# Patient Record
Sex: Female | Born: 1990 | Race: White | Hispanic: No | Marital: Married | Smoking: Never smoker
Health system: Southern US, Community
[De-identification: ages and names within clinical notes are randomized; demographics above are authoritative.]

## PROBLEM LIST (undated history)

## (undated) ENCOUNTER — Inpatient Hospital Stay: Payer: Self-pay

## (undated) DIAGNOSIS — F419 Anxiety disorder, unspecified: Secondary | ICD-10-CM

## (undated) DIAGNOSIS — R519 Headache, unspecified: Secondary | ICD-10-CM

## (undated) DIAGNOSIS — Z8489 Family history of other specified conditions: Secondary | ICD-10-CM

## (undated) DIAGNOSIS — R51 Headache: Secondary | ICD-10-CM

## (undated) HISTORY — DX: Anxiety disorder, unspecified: F41.9

## (undated) HISTORY — PX: NO PAST SURGERIES: SHX2092

## (undated) HISTORY — PX: TUBAL LIGATION: SHX77

---

## 2009-04-14 ENCOUNTER — Ambulatory Visit: Payer: Self-pay

## 2009-05-14 ENCOUNTER — Inpatient Hospital Stay: Payer: Self-pay | Admitting: Unknown Physician Specialty

## 2011-09-12 ENCOUNTER — Ambulatory Visit: Payer: Self-pay

## 2014-09-16 ENCOUNTER — Inpatient Hospital Stay: Payer: Self-pay

## 2014-09-16 LAB — CBC WITH DIFFERENTIAL/PLATELET
Basophil #: 0 10*3/uL (ref 0.0–0.1)
Basophil %: 0.3 %
EOS ABS: 0 10*3/uL (ref 0.0–0.7)
Eosinophil %: 0.2 %
HCT: 32.5 % — ABNORMAL LOW (ref 35.0–47.0)
HGB: 10.5 g/dL — ABNORMAL LOW (ref 12.0–16.0)
LYMPHS PCT: 23.1 %
Lymphocyte #: 3 10*3/uL (ref 1.0–3.6)
MCH: 27.1 pg (ref 26.0–34.0)
MCHC: 32.3 g/dL (ref 32.0–36.0)
MCV: 84 fL (ref 80–100)
Monocyte #: 0.9 x10 3/mm (ref 0.2–0.9)
Monocyte %: 7 %
NEUTROS ABS: 8.9 10*3/uL — AB (ref 1.4–6.5)
NEUTROS PCT: 69.4 %
Platelet: 315 10*3/uL (ref 150–440)
RBC: 3.87 10*6/uL (ref 3.80–5.20)
RDW: 14.3 % (ref 11.5–14.5)
WBC: 12.8 10*3/uL — ABNORMAL HIGH (ref 3.6–11.0)

## 2014-09-17 LAB — GC/CHLAMYDIA PROBE AMP

## 2014-09-17 LAB — HEMATOCRIT: HCT: 29.9 % — ABNORMAL LOW (ref 35.0–47.0)

## 2014-12-28 NOTE — H&P (Signed)
L&D Evaluation:  History:  HPI 24 year old G3 p1011 with EDC= 09/25/2014 by LMP=12/19/2013 and c/w a 7wk6d ultrasound presents at 38 5/7 weeks with c/o onset ctxs at 0545 this Am. SROM for clear fluid shortly after arrival at [facility]. [facility] unremark[facility] except for heartburn/reflux for which she has taken Zantac and Protonix. No VB. Baby active. LABS: O POS/RI/VI/GBS neg. Will be breastfeeding baby Reuel BoomDaniel. Had TDAP this pregnancy. HX of rapid labor with G1 delivering a 7#8oz baby in 2010   Presents with contractions   Patient's Medical History heartburn/GERD   Patient's Surgical History none   Medications Pre Natal Vitamins   Allergies Sulfa, and Benadryl   Social History none   Family History Non-Contributory   ROS:  ROS see [facility]   Exam:  Vital Signs stable   General breathing with contractions   Mental Status clear   Chest clear   Heart normal sinus rhythm, no murmur/gallop/rubs   Abdomen gravid, tender with contractions   Estimated Fetal Weight Average for gestational age   Fetal Position cephalic   Edema no edema   Pelvic no external lesions, 5/90%/-1 per RN exam   Mebranes Ruptured, gross rupture   Description clear   FHT normal rate with no decels, 135 baseline with accels to 150s to 160s   FHT Description mod variability   Ucx regular   Ucx Frequency 4 min   Skin dry   Impression:  Impression IUP at 38 5/7 weeks in active labor   Plan:  Plan EFM/NST, monitor contractions and for cervical change, expectanat management   Electronic Signatures: Trinna BalloonGutierrez, Maitland Muhlbauer L (CNM)  (Signed 28-Jan-16 08:51)  Authored: L&D Evaluation   Last Updated: 28-Jan-16 08:51 by Trinna BalloonGutierrez, Saliah Crisp L (CNM)

## 2016-05-08 ENCOUNTER — Encounter
Admission: RE | Admit: 2016-05-08 | Discharge: 2016-05-08 | Disposition: A | Discharge status: Home / Self Care | Payer: 59 | Source: Ambulatory Visit | Attending: Obstetrics & Gynecology | Admitting: Obstetrics & Gynecology

## 2016-05-08 ENCOUNTER — Encounter: Payer: Self-pay | Admitting: *Deleted

## 2016-05-08 DIAGNOSIS — Z01818 Encounter for other preprocedural examination: Secondary | ICD-10-CM | POA: Diagnosis present

## 2016-05-08 HISTORY — DX: Headache, unspecified: R51.9

## 2016-05-08 HISTORY — DX: Headache: R51

## 2016-05-08 LAB — CBC
HEMATOCRIT: 41.8 % (ref 35.0–47.0)
Hemoglobin: 14.5 g/dL (ref 12.0–16.0)
MCH: 31.5 pg (ref 26.0–34.0)
MCHC: 34.6 g/dL (ref 32.0–36.0)
MCV: 91.3 fL (ref 80.0–100.0)
Platelets: 273 10*3/uL (ref 150–440)
RBC: 4.58 MIL/uL (ref 3.80–5.20)
RDW: 12.3 % (ref 11.5–14.5)
WBC: 5.2 10*3/uL (ref 3.6–11.0)

## 2016-05-08 LAB — TYPE AND SCREEN
ABO/RH(D): O POS
ANTIBODY SCREEN: NEGATIVE

## 2016-05-08 NOTE — Patient Instructions (Signed)
  Your procedure is scheduled on: 05/18/16 Fri Report to Same Day Surgery 2nd floor medical mall To find out your arrival time please call [phone] between 1PM - 3PM on 05/17/16 Thurs  Remember: Instructions that are not followed completely may result in serious medical risk, up to and including death, or upon the discretion of your surgeon and anesthesiologist your surgery may need to be rescheduled.    _x___ 1. Do not eat food or drink liquids after midnight. No gum chewing or hard candies.     __x__ 2. No Alcohol for 24 hours before or after surgery.   __x__3. No Smoking for 24 prior to surgery.   ____  4. Bring all medications with you on the day of surgery if instructed.    __x__ 5. Notify your doctor if there is any change in your medical condition     (cold, fever, infections).     Do not wear jewelry, make-up, hairpins, clips or nail polish.  Do not wear lotions, powders, or perfumes. You may wear deodorant.  Do not shave 48 hours prior to surgery. Men may shave face and neck.  Do not bring valuables to the hospital.    [facility] is not responsible for any belongings or valuables.               Contacts, dentures or bridgework may not be worn into surgery.  Leave your suitcase in the car. After surgery it may be brought to your room.  For patients admitted to the hospital, discharge time is determined by your treatment team.   Patients discharged the day of surgery will not be allowed to drive home.    Please read over the following fact sheets that you were given:   [facility] Preparing for Surgery and or MRSA Information   _x___ Take these medicines the morning of surgery with A SIP OF WATER:    1.norethindrone (MICRONOR,CAMILA,ERRIN) 0.35 MG tablet  2.  3.  4.  5.  6.  ____ Fleet Enema (as directed)   _x___ Use CHG Soap or sage wipes as directed on instruction sheet   ____ Use inhalers on the day of surgery and bring to hospital day of surgery  ____  Stop metformin 2 days prior to surgery    ____ Take 1/2 of usual insulin dose the night before surgery and none on the morning of           surgery.   ____ Stop aspirin or coumadin, or plavix  _x__ Stop Anti-inflammatories such as Advil, Aleve, Ibuprofen, Motrin, Naproxen,          Naprosyn, Goodies powders or aspirin products. Ok to take Tylenol.   ____ Stop supplements until after surgery.    ____ Bring C-Pap to the hospital.

## 2016-05-18 ENCOUNTER — Encounter
Admission: RE | Disposition: A | Discharge status: Home / Self Care | Payer: Self-pay | Source: Ambulatory Visit | Attending: Obstetrics & Gynecology

## 2016-05-18 ENCOUNTER — Ambulatory Visit: Payer: 59 | Admitting: Anesthesiology

## 2016-05-18 ENCOUNTER — Ambulatory Visit
Admission: RE | Admit: 2016-05-18 | Discharge: 2016-05-18 | Disposition: A | Discharge status: Home / Self Care | Payer: 59 | Source: Ambulatory Visit | Attending: Obstetrics & Gynecology | Admitting: Obstetrics & Gynecology

## 2016-05-18 DIAGNOSIS — N83201 Unspecified ovarian cyst, right side: Secondary | ICD-10-CM | POA: Diagnosis present

## 2016-05-18 DIAGNOSIS — N803 Endometriosis of pelvic peritoneum: Secondary | ICD-10-CM | POA: Insufficient documentation

## 2016-05-18 DIAGNOSIS — N8301 Follicular cyst of right ovary: Secondary | ICD-10-CM | POA: Insufficient documentation

## 2016-05-18 HISTORY — PX: LAPAROSCOPIC OVARIAN CYSTECTOMY: SHX6248

## 2016-05-18 LAB — ABO/RH: ABO/RH(D): O POS

## 2016-05-18 LAB — POCT PREGNANCY, URINE: Preg Test, Ur: NEGATIVE

## 2016-05-18 SURGERY — EXCISION, CYST, OVARY, LAPAROSCOPIC
Anesthesia: General | Site: Abdomen | Laterality: Right | Wound class: Clean

## 2016-05-18 MED ORDER — FENTANYL CITRATE (PF) 100 MCG/2ML IJ SOLN
25.0000 ug | INTRAMUSCULAR | Status: DC | PRN
  Administered 2016-05-18 (×4): 25 ug via INTRAVENOUS

## 2016-05-18 MED ORDER — FENTANYL CITRATE (PF) 100 MCG/2ML IJ SOLN
INTRAMUSCULAR | Status: AC
  Administered 2016-05-18: 25 ug via INTRAVENOUS
  Filled 2016-05-18: qty 2

## 2016-05-18 MED ORDER — SUGAMMADEX SODIUM 200 MG/2ML IV SOLN
INTRAVENOUS | Status: DC | PRN
  Administered 2016-05-18: 150 mg via INTRAVENOUS

## 2016-05-18 MED ORDER — ACETAMINOPHEN 10 MG/ML IV SOLN
INTRAVENOUS | Status: AC
Start: 2016-05-18 — End: 2016-05-18
  Filled 2016-05-18: qty 100

## 2016-05-18 MED ORDER — KETOROLAC TROMETHAMINE 30 MG/ML IJ SOLN
INTRAMUSCULAR | Status: DC | PRN
  Administered 2016-05-18: 30 mg via INTRAVENOUS

## 2016-05-18 MED ORDER — OXYCODONE-ACETAMINOPHEN 5-325 MG PO TABS: 1.0000 | ORAL_TABLET | ORAL | 0 refills | Status: DC | PRN

## 2016-05-18 MED ORDER — ACETAMINOPHEN 10 MG/ML IV SOLN
INTRAVENOUS | Status: DC | PRN
  Administered 2016-05-18: 1000 mg via INTRAVENOUS

## 2016-05-18 MED ORDER — DEXAMETHASONE SODIUM PHOSPHATE 10 MG/ML IJ SOLN
INTRAMUSCULAR | Status: DC | PRN
  Administered 2016-05-18: 10 mg via INTRAVENOUS

## 2016-05-18 MED ORDER — LIDOCAINE HCL (CARDIAC) 20 MG/ML IV SOLN
INTRAVENOUS | Status: DC | PRN
  Administered 2016-05-18: 100 mg via INTRAVENOUS

## 2016-05-18 MED ORDER — ROCURONIUM BROMIDE 100 MG/10ML IV SOLN
INTRAVENOUS | Status: DC | PRN
  Administered 2016-05-18: 40 mg via INTRAVENOUS

## 2016-05-18 MED ORDER — FAMOTIDINE 20 MG PO TABS
20.0000 mg | ORAL_TABLET | Freq: Once | ORAL | Status: AC
  Administered 2016-05-18: 20 mg via ORAL

## 2016-05-18 MED ORDER — FENTANYL CITRATE (PF) 100 MCG/2ML IJ SOLN
INTRAMUSCULAR | Status: DC | PRN
  Administered 2016-05-18 (×2): 50 ug via INTRAVENOUS

## 2016-05-18 MED ORDER — ONDANSETRON HCL 4 MG/2ML IJ SOLN
INTRAMUSCULAR | Status: DC | PRN
  Administered 2016-05-18: 4 mg via INTRAVENOUS

## 2016-05-18 MED ORDER — LACTATED RINGERS IV SOLN
INTRAVENOUS | Status: DC
  Administered 2016-05-18: 15:00:00 via INTRAVENOUS

## 2016-05-18 MED ORDER — PROPOFOL 10 MG/ML IV BOLUS
INTRAVENOUS | Status: DC | PRN
  Administered 2016-05-18: 150 mg via INTRAVENOUS
  Administered 2016-05-18: 50 mg via INTRAVENOUS

## 2016-05-18 MED ORDER — FAMOTIDINE 20 MG PO TABS
ORAL_TABLET | ORAL | Status: AC
  Administered 2016-05-18: 20 mg via ORAL
  Filled 2016-05-18: qty 1

## 2016-05-18 MED ORDER — MIDAZOLAM HCL 2 MG/2ML IJ SOLN
INTRAMUSCULAR | Status: DC | PRN
  Administered 2016-05-18: 2 mg via INTRAVENOUS

## 2016-05-18 MED ORDER — BUPIVACAINE HCL (PF) 0.5 % IJ SOLN
INTRAMUSCULAR | Status: DC | PRN
  Administered 2016-05-18: 7 mL

## 2016-05-18 MED ORDER — PHENYLEPHRINE HCL 10 MG/ML IJ SOLN
INTRAMUSCULAR | Status: DC | PRN
  Administered 2016-05-18 (×4): 100 ug via INTRAVENOUS

## 2016-05-18 MED ORDER — ONDANSETRON HCL 4 MG/2ML IJ SOLN: 4.0000 mg | Freq: Once | INTRAMUSCULAR | Status: DC | PRN

## 2016-05-18 SURGICAL SUPPLY — 37 items
BLADE SURG SZ11 CARB STEEL (BLADE) ×3 IMPLANT
CANISTER SUCT 1200ML W/VALVE (MISCELLANEOUS) ×3 IMPLANT
CATH ROBINSON RED A/P 16FR (CATHETERS) ×3 IMPLANT
CHLORAPREP W/TINT 26ML (MISCELLANEOUS) ×3 IMPLANT
DRESSING TELFA 4X3 1S ST N-ADH (GAUZE/BANDAGES/DRESSINGS) IMPLANT
DRSG TEGADERM 2-3/8X2-3/4 SM (GAUZE/BANDAGES/DRESSINGS) ×3 IMPLANT
DRSG TEGADERM 2X2.25 PEDS (GAUZE/BANDAGES/DRESSINGS) ×3 IMPLANT
ENDOPOUCH RETRIEVER 10 (MISCELLANEOUS) IMPLANT
GAUZE SPONGE NON-WVN 2X2 STRL (MISCELLANEOUS) IMPLANT
GLOVE BIO SURGEON STRL SZ8 (GLOVE) ×3 IMPLANT
GLOVE INDICATOR 8.0 STRL GRN (GLOVE) ×3 IMPLANT
GOWN STRL REUS W/ TWL LRG LVL3 (GOWN DISPOSABLE) ×1 IMPLANT
GOWN STRL REUS W/ TWL XL LVL3 (GOWN DISPOSABLE) ×1 IMPLANT
GOWN STRL REUS W/TWL LRG LVL3 (GOWN DISPOSABLE) ×2
GOWN STRL REUS W/TWL XL LVL3 (GOWN DISPOSABLE) ×2
IRRIGATION STRYKERFLOW (MISCELLANEOUS) ×1 IMPLANT
IRRIGATOR STRYKERFLOW (MISCELLANEOUS) ×3
IV LACTATED RINGERS 1000ML (IV SOLUTION) IMPLANT
KIT PINK PAD W/HEAD ARE REST (MISCELLANEOUS) ×3
KIT PINK PAD W/HEAD ARM REST (MISCELLANEOUS) ×1 IMPLANT
LABEL OR SOLS (LABEL) ×3 IMPLANT
LIQUID BAND (GAUZE/BANDAGES/DRESSINGS) ×3 IMPLANT
NEEDLE VERESS 14GA 120MM (NEEDLE) ×3 IMPLANT
NS IRRIG 500ML POUR BTL (IV SOLUTION) ×3 IMPLANT
PACK GYN LAPAROSCOPIC (MISCELLANEOUS) ×3 IMPLANT
PAD PREP 24X41 OB/GYN DISP (PERSONAL CARE ITEMS) ×3 IMPLANT
SCISSORS METZENBAUM CVD 33 (INSTRUMENTS) ×3 IMPLANT
SHEARS HARMONIC ACE PLUS 36CM (ENDOMECHANICALS) IMPLANT
SLEEVE ENDOPATH XCEL 5M (ENDOMECHANICALS) IMPLANT
SPONGE VERSALON 2X2 STRL (MISCELLANEOUS)
STRAP SAFETY BODY (MISCELLANEOUS) ×3 IMPLANT
SUT VIC AB 2-0 UR6 27 (SUTURE) IMPLANT
SUT VIC AB 4-0 PS2 18 (SUTURE) IMPLANT
SYRINGE 10CC LL (SYRINGE) ×3 IMPLANT
TROCAR ENDO BLADELESS 11MM (ENDOMECHANICALS) IMPLANT
TROCAR XCEL NON-BLD 5MMX100MML (ENDOMECHANICALS) ×3 IMPLANT
TUBING INSUFFLATOR HI FLOW (MISCELLANEOUS) ×3 IMPLANT

## 2016-05-18 NOTE — Anesthesia Postprocedure Evaluation (Signed)
Anesthesia Post Note  Patient: Sara Webb Base  Procedure(s) Performed: Procedure(s) (LRB): LAPAROSCOPIC OVARIAN CYSTECTOMY (Right)  Patient location during evaluation: PACU Anesthesia Type: General Level of consciousness: awake and alert Pain management: pain level controlled Vital Signs Assessment: post-procedure vital signs reviewed and stable Respiratory status: spontaneous breathing and respiratory function stable Cardiovascular status: stable Anesthetic complications: no    Last Vitals:  Vitals:   05/18/16 1814 05/18/16 1836  BP: 107/72 108/71  Pulse: 74 76  Resp: 14 14  Temp: 37.4 C 37.8 C    Last Pain:  Vitals:   05/18/16 1836  TempSrc: Temporal  PainSc: 3                  Simran Bomkamp K

## 2016-05-18 NOTE — Op Note (Signed)
  Operative Note   05/18/2016  PRE-OP DIAGNOSIS: Right Ovarian Cyst, Pelvic Pain   POST-OP DIAGNOSIS: same, and endometriosis   PROCEDURE: Procedure(s): LAPAROSCOPIC OVARIAN CYSTECTOMY  EXCISION OF ENDOMETRIOSIS  SURGEON: Annamarie MajorPaul Amamda Curbow, MD, FACOG  ANESTHESIA: General   ESTIMATED BLOOD LOSS: Min  COMPLICATIONS: None  DISPOSITION: PACU - hemodynamically stable.  CONDITION: stable  FINDINGS: Laparoscopic survey of the abdomen revealed a grossly normal uterus, tubes, ovaries, liver edge, gallbladder edge and appendix, No intra-abdominal adhesions were noted. Left ovarian fossa endometriosis seen.  Right simple Ovarian Cyst noted.  PROCEDURE IN DETAIL: The patient was taken to the OR where anesthesia was administed. The patient was positioned in dorsal lithotomy in the [place] stirrups. The patient was then examined under anesthesia with the above noted findings. The patient was prepped and draped in the normal sterile fashion and foley catheter was placed. A Graves speculum was placed in the vagina and the anterior lip of the cervix was grasped with a single toothed tenaculum. A Hulka uterine manipulator was then inserted in the uterus. Uterine mobility was found to be satisfactory. The speculum was then removed.  Attention was turned to the patient's abdomen where a 5 mm skin incision was made in the umbilical fold, after injection of local anesthesia. The Veress step needle was carefully introduced into the peritoneal cavity with placement confirmed using the hanging drop technique.  Pneumoperitoneum was obtained. The 5 mm port was then placed under direct visualization with the operative laparoscope.  Trendelenburg positioning.  Additional 5mm trocar was then placed in the suprapubic region and LLQ lateral to the inferior epigastric blood vessels under direct visualization with the laparoscope.  Instrumentation to visualize complete pelvic anatomy performed. The ovarian cyst is identified and  stabilized.  An incision is made with clear fluid noted.  Fluid is aspirated.  Cyst wall is dissected free from the ovarian cortex and removed.  Hemostasis is visualized and assured.  Contralateral ovary seen as normal.   Areas of endometriosis in the post ovarian fossa is carefully grasped and excised using the harmonic scapel.  Hemostasis assured.  Pelvic cavity is cleaned with any fluid aspirated.  Instruments and trocars removed, gas expelled, and skin closed with skin adhesive glue.  Instrument, needle, and sponge counts correct x2 at the conclusion of the case.  Pt goes to recovery room in stable condition.

## 2016-05-18 NOTE — H&P (Signed)
History and Physical Interval Note:  05/18/2016 2:10 PM  Sara Webb  has presented today for surgery, with the diagnosis of ACUTE ABDOMINAL PAIN,RIGHT OVARIAN CYST  The various methods of treatment have been discussed with the patient and family. After consideration of risks, benefits and other options for treatment, the patient has consented to  Procedure(s): LAPAROSCOPIC OVARIAN CYSTECTOMY (Right) as a surgical intervention .  The patient's history has been reviewed, patient examined, no change in status, stable for surgery.  Pt has the following beta blocker history-  Not taking Beta Blocker.  I have reviewed the patient's chart and labs.  Questions were answered to the patient's satisfaction.       Letitia Libraobert Paul Blondine Hottel

## 2016-05-18 NOTE — Anesthesia Preprocedure Evaluation (Addendum)
Anesthesia Evaluation  Patient identified by MRN, date of birth, ID band Patient awake    Reviewed: Allergy & Precautions, NPO status , Patient's Chart, lab work & pertinent test results  History of Anesthesia Complications (+) Family history of anesthesia reaction and history of anesthetic complications  Airway Mallampati: II  TM Distance: >3 FB     Dental no notable dental hx.    Pulmonary neg pulmonary ROS,    Pulmonary exam normal        Cardiovascular negative cardio ROS Normal cardiovascular exam     Neuro/Psych  Headaches, negative psych ROS   GI/Hepatic negative GI ROS, Neg liver ROS,   Endo/Other  negative endocrine ROS  Renal/GU negative Renal ROS     Musculoskeletal negative musculoskeletal ROS (+)   Abdominal Normal abdominal exam  (+)   Peds negative pediatric ROS (+)  Hematology negative hematology ROS (+)   Anesthesia Other Findings Maternal Aunt has a Hx of a Pseudocholinesterase deficiency  Reproductive/Obstetrics negative OB ROS                            Anesthesia Physical Anesthesia Plan  ASA: I  Anesthesia Plan: General   Post-op Pain Management:    Induction: Intravenous  Airway Management Planned: Oral ETT  Additional Equipment:   Intra-op Plan:   Post-operative Plan: Extubation in OR  Informed Consent: I have reviewed the patients History and Physical, chart, labs and discussed the procedure including the risks, benefits and alternatives for the proposed anesthesia with the patient or authorized representative who has indicated his/her understanding and acceptance.   Dental advisory given  Plan Discussed with: CRNA and Surgeon  Anesthesia Plan Comments:         Anesthesia Quick Evaluation

## 2016-05-18 NOTE — Anesthesia Procedure Notes (Signed)
Procedure Name: Intubation Date/Time: 05/18/2016 4:30 PM Performed by: Irving BurtonBACHICH, Iman Orourke Pre-anesthesia Checklist: Patient identified, Emergency Drugs available, Suction available and Patient being monitored Patient Re-evaluated:Patient Re-evaluated prior to inductionOxygen Delivery Method: Circle system utilized Preoxygenation: Pre-oxygenation with 100% oxygen Ventilation: Mask ventilation without difficulty Laryngoscope Size: Mac and 3 Grade View: Grade I Tube type: Oral Tube size: 7.0 mm Number of attempts: 1 Airway Equipment and Method: Stylet Placement Confirmation: ETT inserted through vocal cords under direct vision,  positive ETCO2 and breath sounds checked- equal and bilateral Secured at: 21 cm Tube secured with: Tape Dental Injury: Teeth and Oropharynx as per pre-operative assessment

## 2016-05-18 NOTE — Transfer of Care (Signed)
Immediate Anesthesia Transfer of Care Note  Patient: Sara Webb  Procedure(s) Performed: Procedure(s): LAPAROSCOPIC OVARIAN CYSTECTOMY (Right)  Patient Location: PACU  Anesthesia Type:General  Level of Consciousness: awake, alert  and oriented  Airway & Oxygen Therapy: Patient connected to face mask oxygen  Post-op Assessment: Post -op Vital signs reviewed and stable  Post vital signs: stable  Last Vitals:  Vitals:   05/18/16 1404 05/18/16 1725  BP: 118/72 124/68  Pulse: 69 90  Resp: 16 18  Temp: 36.5 C 36.9 C    Last Pain:  Vitals:   05/18/16 1725  TempSrc: Temporal         Complications: No apparent anesthesia complications

## 2016-05-18 NOTE — Discharge Instructions (Signed)
General Gynecological Post-Operative Instructions °You may expect to feel dizzy, weak, and drowsy for as long as 24 hours after receiving the medicine that made you sleep (anesthetic).  °Do not drive a car, ride a bicycle, participate in physical activities, or take public transportation until you are done taking narcotic pain medicines or as directed by your doctor.  °Do not drink alcohol or take tranquilizers.  °Do not take medicine that has not been prescribed by your doctor.  °Do not sign important papers or make important decisions while on narcotic pain medicines.  °Have a responsible person with you.  °CARE OF INCISION  °Keep incision clean and dry. °Take showers instead of baths until your doctor gives you permission to take baths.  °Avoid heavy lifting (more than 10 pounds/4.5 kilograms), pushing, or pulling.  °Avoid activities that may risk injury to your surgical site.  °No sexual intercourse or placement of anything in the vagina for 2 weeks or as instructed by your doctor. °If you have tubes coming from the wound site, check with your doctor regarding appropriate care of the tubes. °Only take prescription or over-the-counter medicines  for pain, discomfort, or fever as directed by your doctor. Do not take aspirin. It can make you bleed. Take medicines (antibiotics) that kill germs if they are prescribed for you.  °Call the office or go to the ER if:  °You feel sick to your stomach (nauseous) and you start to throw up (vomit).  °You have trouble eating or drinking.  °You have an oral temperature above 101.  °You have constipation that is not helped by adjusting diet or increasing fluid intake. Pain medicines are a common cause of constipation.  °You have any other concerns. °SEEK IMMEDIATE MEDICAL CARE IF:  °You have persistent dizziness.  °You have difficulty breathing or a congested sounding (croupy) cough.  °You have an oral temperature above 102.5, not controlled by medicine.  °There is increasing  pain or tenderness near or in the surgical site.  ° °AMBULATORY SURGERY  °DISCHARGE INSTRUCTIONS ° ° °1) The drugs that you were given will stay in your system until tomorrow so for the next 24 hours you should not: ° °A) Drive an automobile °B) Make any legal decisions °C) Drink any alcoholic beverage ° ° °2) You may resume regular meals tomorrow.  Today it is better to start with liquids and gradually work up to solid foods. ° °You may eat anything you prefer, but it is better to start with liquids, then soup and crackers, and gradually work up to solid foods. ° ° °3) Please notify your doctor immediately if you have any unusual bleeding, trouble breathing, redness and pain at the surgery site, drainage, fever, or pain not relieved by medication. ° ° ° °4) Additional Instructions: ° ° ° ° ° ° ° °Please contact your physician with any problems or Same Day Surgery at [phone], Monday through Friday 6 am to 4 pm, or [place] at  Main number at [phone]. ° ° °

## 2016-05-19 ENCOUNTER — Encounter: Payer: Self-pay | Admitting: Obstetrics & Gynecology

## 2016-05-24 LAB — SURGICAL PATHOLOGY

## 2016-08-20 NOTE — L&D Delivery Note (Signed)
0150 In room to see patient, reports pressure with the urge to push. SVE: 10/100/+2, BBOW, vertex. AROM clear fluid, moderate amount. Effective maternal pushing efforts.   Spontaneous vaginal birth of live born female infant at 8 over intact perineum in vertex presentation and ROA position. Loose nuchal and leg cord reduced after birth. Infant immediately to maternal abdomen. Delayed cord clamping, three (3) vessel cord, cord blood collected. APGARS: 8, 9. Weight: 3090 grams, 6 pounds and 13 ounces. Receiving nurse at bedside for birth.   Spontaneous vaginal delivery of placenta at 0200. Large gush of blood after delivery of placenta. 800 mcg Cytotec placed due to precipitous birth. Pitocin infusing. Uterus firm. Right periurethral laceration hemostatic, not repaired. QBL: 501 ml.   FOB at beside for birth.   Mom to postpartum.  Baby to Couplet care / Skin to Skin. Initiate routine postpartum care and orders.   Serafina Royals 04/09/2017, 0226 AM

## 2016-08-23 ENCOUNTER — Ambulatory Visit (INDEPENDENT_AMBULATORY_CARE_PROVIDER_SITE_OTHER): Payer: 59 | Admitting: Certified Nurse Midwife

## 2016-08-23 ENCOUNTER — Encounter: Payer: Self-pay | Admitting: Certified Nurse Midwife

## 2016-08-23 VITALS — BP 104/64 | HR 64 | Ht 67.0 in | Wt 142.1 lb

## 2016-08-23 DIAGNOSIS — N926 Irregular menstruation, unspecified: Secondary | ICD-10-CM | POA: Diagnosis not present

## 2016-08-23 DIAGNOSIS — Z3201 Encounter for pregnancy test, result positive: Secondary | ICD-10-CM

## 2016-08-23 LAB — POCT URINE PREGNANCY: PREG TEST UR: POSITIVE — AB

## 2016-08-23 NOTE — Progress Notes (Signed)
GYN ENCOUNTER NOTE  Subjective:       Sara Webb is a 26 y.o. G3P2 female is here for gynecologic evaluation of the following issues: missed menses and positive home pregnancy test.   She reports mild nausea that resolves with eating.   Denies difficulty breathing, SOB, abdominal pain, or vaginal bleeding.   She was previously a patient of [facility]. In September 2017, she had a right ovarian cyst and areas of endometriosis removed. At this time, Sara Webb also stopped her OCP due to irregular bleeding.   Sara Webb and her husband, who works for [employer], were discussing pregnancy and are excited, but shocked by the speed since it took them over a year to get pregnancy with their last son.   She has two sons: seven and almost two years old.   Gynecologic History  Patient's last menstrual period was 07/14/2016 (approximate).   EDD: 04/20/2017.  Gestational age: 80 weeks 5 days  Contraception: OCP (estrogen/progesterone)   Last Pap: 10/2015. Results were: normal   Obstetric History OB History  Gravida Para Term Preterm AB Living  3 2 2     2   SAB TAB Ectopic Multiple Live Births          2    # Outcome Date GA Lbr Len/2nd Weight Sex Delivery Anes PTL Lv  3 Current           2 Term     M Vag-Spont   LIV  1 Term     M Vag-Spont   LIV      Past Medical History:  Diagnosis Date  . Headache     Past Surgical History:  Procedure Laterality Date  . LAPAROSCOPIC OVARIAN CYSTECTOMY Right 05/18/2016   Procedure: LAPAROSCOPIC OVARIAN CYSTECTOMY;  Surgeon: Nadara Mustardobert P Harris, MD;  Location: [facility] ORS;  Service: Gynecology;  Laterality: Right;  . NO PAST SURGERIES      Current Outpatient Prescriptions on File Prior to Visit  Medication Sig Dispense Refill  . Multiple Vitamin (MULTIVITAMIN) tablet Take 1 tablet by mouth daily.     No current facility-administered medications on file prior to visit.     Allergies  Allergen Reactions  . Benadryl [Diphenhydramine Hcl  (Sleep)] Other (See Comments)    Hyperactive and fast heart rate  . Sulfa Antibiotics     Social History   Social History  . Marital status: Married    Spouse name: N/A  . Number of children: N/A  . Years of education: N/A   Occupational History  . Not on file.   Social History Main Topics  . Smoking status: Never Smoker  . Smokeless tobacco: Never Used  . Alcohol use No  . Drug use: No  . Sexual activity: Yes    Birth control/ protection: None   Other Topics Concern  . Not on file   Social History Narrative  . No narrative on file    History reviewed. No pertinent family history.  The following portions of the patient's history were reviewed and updated as appropriate: allergies, current medications, past family history, past medical history, past social history, past surgical history and problem list.  Review of Systems Review of Systems - negative except for as noted above  Objective:   BP 104/64   Pulse 64   Ht 5\' 7"  (1.702 m)   Wt 142 lb 1 oz (64.4 kg)   LMP 07/14/2016 (Approximate)   BMI 22.25 kg/m    CONSTITUTIONAL: Well-developed female in no acute  distress.   LUNGS: CTAB  HEART: RRR  ABDOMEN: Soft, round, and non-tender  SKIN: clean, dry, and intact. No rashes or lesions noted  MUSCULOSKELETAL: Normal range of motion. No tenderness.     Assessment:   1. Positive pregnancy test  2. Missed menses    Plan:   Lab work RTC x 2-3 weeks for viability [place] and nurse intake.  RTC sooner if needed.  Reviewed red flag symptoms and reasons to call.  Continue taking PNV w/folic acid and DHA.    Gunnar Bulla, CNM

## 2016-08-23 NOTE — Patient Instructions (Signed)

## 2016-08-28 ENCOUNTER — Other Ambulatory Visit: Payer: 59

## 2016-08-29 LAB — BETA HCG QUANT (REF LAB): hCG Quant: 40934 m[IU]/mL

## 2016-09-11 ENCOUNTER — Other Ambulatory Visit: Payer: Self-pay | Admitting: Certified Nurse Midwife

## 2016-09-11 ENCOUNTER — Ambulatory Visit (INDEPENDENT_AMBULATORY_CARE_PROVIDER_SITE_OTHER): Payer: 59 | Admitting: Obstetrics and Gynecology

## 2016-09-11 ENCOUNTER — Ambulatory Visit (INDEPENDENT_AMBULATORY_CARE_PROVIDER_SITE_OTHER): Payer: 59

## 2016-09-11 VITALS — BP 111/63 | HR 73 | Ht 67.0 in | Wt 141.6 lb

## 2016-09-11 DIAGNOSIS — Z3481 Encounter for supervision of other normal pregnancy, first trimester: Secondary | ICD-10-CM

## 2016-09-11 DIAGNOSIS — Z3201 Encounter for pregnancy test, result positive: Secondary | ICD-10-CM

## 2016-09-11 DIAGNOSIS — Z113 Encounter for screening for infections with a predominantly sexual mode of transmission: Secondary | ICD-10-CM

## 2016-09-11 DIAGNOSIS — Z1389 Encounter for screening for other disorder: Secondary | ICD-10-CM

## 2016-09-11 NOTE — Progress Notes (Signed)
Sara Webb presents for NOB nurse interview visit. [facility]. UPT- positive.  G-4  P-2012. Had ectopic pregnancy 2015. Ultrasound results were 8.5 wks. With EDD 04/18/2017. This is consistent with LMP 07/14/2016. Pregnancy education material explained and given. No cats in the home. NOB labs ordered.  HIV labs and Drug screen were explained optional and she did not decline. Drug screen ordered. PNV encouraged. Genetic screening discussed and pt would like MaterniT. Will schedule genetic testing for next week. FOB, maternal aunt has Down's Syndrome. Due to maternal aunt's age 49(40) they think it was most likely related to that. Has not had any previous genetic testing with other pregnancies.  Pt. To follow up with provider in 3 weeks for NOB physical.  All questions answered.

## 2016-09-11 NOTE — Patient Instructions (Signed)
Pregnancy and Zika Virus Disease Introduction Zika virus disease, or Zika, is an illness that can spread to people from mosquitoes that carry the virus. It may also spread from person to person through infected body fluids. Zika first occurred in [place], but recently it has spread to new areas. The virus occurs in tropical climates. The location of Zika continues to change. Most people who become infected with Zika virus do not develop serious illness. However, Zika may cause birth defects in an unborn baby whose mother is infected with the virus. It may also increase the risk of miscarriage. What are the symptoms of Zika virus disease? In many cases, people who have been infected with Zika virus do not develop any symptoms. If symptoms appear, they usually start about a week after the person is infected. Symptoms are usually mild. They may include:  Fever.  Rash.  Red eyes.  Joint pain. How does Zika virus disease spread? The main way that Zika virus spreads is through the bite of a certain type of mosquito. Unlike most types of mosquitos, which bite only at night, the type of mosquito that carries Zika virus bites both at night and during the day. Zika virus can also spread through sexual contact, through a blood transfusion, and from a mother to her baby before or during birth. Once you have had Zika virus disease, it is unlikely that you will get it again. Can I pass Zika to my baby during pregnancy? Yes, Zika can pass from a mother to her baby before or during birth. What problems can Zika cause for my baby? A woman who is infected with Zika virus while pregnant is at risk of having her baby born with a condition in which the brain or head is smaller than expected (microcephaly). Babies who have microcephaly can have developmental delays, seizures, hearing problems, and vision problems. Having Zika virus disease during pregnancy can also increase the risk of miscarriage. How can Zika  virus disease be prevented? There is no vaccine to prevent Zika. The best way to prevent the disease is to avoid infected mosquitoes and avoid exposure to body fluids that can spread the virus. Avoid any possible exposure to Zika by taking the following precautions. For women and their sex partners:  Avoid traveling to high-risk areas. The locations where Zika is being reported change often. To identify high-risk areas, check the CDC travel website: www.cdc.gov/zika/geo/index.html  If you or your sex partner must travel to a high-risk area, talk with a health care provider before and after traveling.  Take all precautions to avoid mosquito bites if you live in, or travel to, any of the high-risk areas. Insect repellents are safe to use during pregnancy.  Ask your health care provider when it is safe to have sexual contact. For women:  If you are pregnant or trying to become pregnant, avoid sexual contact with persons who may have been exposed to Zika virus, persons who have possible symptoms of Zika, or persons whose history you are unsure about. If you choose to have sexual contact with someone who may have been exposed to Zika virus, use condoms correctly during the entire duration of sexual activity, every time. Do not share sexual devices, as you may be exposed to body fluids.  Ask your health care provider about when it is safe to attempt pregnancy after a possible exposure to Zika virus. What steps should I take to avoid mosquito bites? Take these steps to avoid mosquito bites when you   are in a high-risk area:  Wear loose clothing that covers your arms and legs.  Limit your outdoor activities.  Do not open windows unless they have window screens.  Sleep under mosquito nets.  Use insect repellent. The best insect repellents have:  DEET, picaridin, oil of lemon eucalyptus (OLE), or [id number] in them.  Higher amounts of an active ingredient in them.  Remember that insect repellents  are safe to use during pregnancy.  Do not use OLE on children who are younger than 3 years of age. Do not use insect repellent on babies who are younger than 2 months of age.  Cover your child's stroller with mosquito netting. Make sure the netting fits snugly and that any loose netting does not cover your child's mouth or nose. Do not use a blanket as a mosquito-protection cover.  Do not apply insect repellent underneath clothing.  If you are using sunscreen, apply the sunscreen before applying the insect repellent.  Treat clothing with permethrin. Do not apply permethrin directly to your skin. Follow label directions for safe use.  Get rid of standing water, where mosquitoes may reproduce. Standing water is often found in items such as buckets, bowls, animal food dishes, and flowerpots. When you return from traveling to any high-risk area, continue taking actions to protect yourself against mosquito bites for 3 weeks, even if you show no signs of illness. This will prevent spreading Zika virus to uninfected mosquitoes. What should I know about the sexual transmission of Zika? People can spread Zika to their sexual partners during vaginal, anal, or oral sex, or by sharing sexual devices. Many people with Zika do not develop symptoms, so a person could spread the disease without knowing that they are infected. The greatest risk is to women who are pregnant or who may become pregnant. Zika virus can live longer in semen than it can live in blood. Couples can prevent sexual transmission of the virus by:  Using condoms correctly during the entire duration of sexual activity, every time. This includes vaginal, anal, and oral sex.  Not sharing sexual devices. Sharing increases your risk of being exposed to body fluid from another person.  Avoiding all sexual activity until your health care provider says it is safe. Should I be tested for Zika virus? A sample of your blood can be tested for Zika  virus. A pregnant woman should be tested if she may have been exposed to the virus or if she has symptoms of Zika. She may also have additional tests done during her pregnancy, such ultrasound testing. Talk with your health care provider about which tests are recommended. This information is not intended to replace advice given to you by your health care provider. Make sure you discuss any questions you have with your health care provider. Document Released: 04/27/2015 Document Revised: 01/12/2016 Document Reviewed: 04/20/2015  2017 Elsevier Minor Illnesses and Medications in Pregnancy  Cold/Flu:  Sudafed for congestion- Robitussin (plain) for cough- Tylenol for discomfort.  Please follow the directions on the label.  Try not to take any more than needed.  OTC Saline nasal spray and air humidifier or cool-mist  Vaporizer to sooth nasal irritation and to loosen congestion.  It is also important to increase intake of non carbonated fluids, especially if you have a fever.  Constipation:  Colace-2 capsules at bedtime; Metamucil- follow directions on label; Senokot- 1 tablet at bedtime.  Any one of these medications can be used.  It is also very important to increase   fluids and fruits along with regular exercise.  If problem persists please call the office.  Diarrhea:  Kaopectate as directed on the label.  Eat a bland diet and increase fluids.  Avoid highly seasoned foods.  Headache:  Tylenol 1 or 2 tablets every 3-4 hours as needed  Indigestion:  Maalox, Mylanta, Tums or Rolaids- as directed on label.  Also try to eat small meals and avoid fatty, greasy or spicy foods.  Nausea with or without Vomiting:  Nausea in pregnancy is caused by increased levels of hormones in the body which influence the digestive system and cause irritation when stomach acids accumulate.  Symptoms usually subside after 1st trimester of pregnancy.  Try the following: 1. Keep saltines, graham crackers or dry toast by your bed to  eat upon awakening. 2. Don't let your stomach get empty.  Try to eat 5-6 small meals per day instead of 3 large ones. 3. Avoid greasy fatty or highly seasoned foods.  4. Take OTC Unisom 1 tablet at bed time along with OTC Vitamin B6 25-50 mg 3 times per day.    If nausea continues with vomiting and you are unable to keep down food and fluids you may need a prescription medication.  Please notify your provider.   Sore throat:  Chloraseptic spray, throat lozenges and or plain Tylenol.  Vaginal Yeast Infection:  OTC Monistat for 7 days as directed on label.  If symptoms do not resolve within a week notify provider.  If any of the above problems do not subside with recommended treatment please call the office for further assistance.   Do not take Aspirin, Advil, Motrin or Ibuprofen.  * * OTC= Over the counter Hyperemesis Gravidarum Hyperemesis gravidarum is a severe form of nausea and vomiting that happens during pregnancy. Hyperemesis is worse than morning sickness. It may cause you to have nausea or vomiting all day for many days. It may keep you from eating and drinking enough food and liquids. Hyperemesis usually occurs during the first half (the first 20 weeks) of pregnancy. It often goes away once a woman is in her second half of pregnancy. However, sometimes hyperemesis continues through an entire pregnancy. What are the causes? The cause of this condition is not known. It may be related to changes in chemicals (hormones) in the body during pregnancy, such as the high level of pregnancy hormone (human chorionic gonadotropin) or the increase in the female sex hormone (estrogen). What are the signs or symptoms? Symptoms of this condition include:  Severe nausea and vomiting.  Nausea that does not go away.  Vomiting that does not allow you to keep any food down.  Weight loss.  Body fluid loss (dehydration).  Having no desire to eat, or not liking food that you have previously  enjoyed. How is this diagnosed? This condition may be diagnosed based on:  A physical exam.  Your medical history.  Your symptoms.  Blood tests.  Urine tests. How is this treated? This condition may be managed with medicine. If medicines to do not help relieve nausea and vomiting, you may need to receive fluids through an IV tube at the hospital. Follow these instructions at home:  Take over-the-counter and prescription medicines only as told by your health care provider.  Avoid iron pills and multivitamins that contain iron for the first 3-4 months of pregnancy. If you take prescription iron pills, do not stop taking them unless your health care provider approves.  Take the following actions to help   prevent nausea and vomiting:  In the morning, before getting out of bed, try eating a couple of dry crackers or a piece of toast.  Avoid foods and smells that upset your stomach. Fatty and spicy foods may make nausea worse.  Eat 5-6 small meals a day.  Do not drink fluids while eating meals. Drink between meals.  Eat or suck on things that have ginger in them. Ginger can help relieve nausea.  Avoid food preparation. The smell of food can spoil your appetite or trigger nausea.  Follow instructions from your health care provider about eating or drinking restrictions.  For snacks, eat high-protein foods, such as cheese.  Keep all follow-up and pre-birth (prenatal) visits as told by your health care provider. This is important. Contact a health care provider if:  You have pain in your abdomen.  You have a severe headache.  You have vision problems.  You are losing weight. Get help right away if:  You cannot drink fluids without vomiting.  You vomit blood.  You have constant nausea and vomiting.  You are very weak.  You are very thirsty.  You feel dizzy.  You faint.  You have a fever or other symptoms that last for more than 2-3 days.  You have a fever and  your symptoms suddenly get worse. Summary  Hyperemesis gravidarum is a severe form of nausea and vomiting that happens during pregnancy.  Making some changes to your eating habits may help relieve nausea and vomiting.  This condition may be managed with medicine.  If medicines to do not help relieve nausea and vomiting, you may need to receive fluids through an IV tube at the hospital. This information is not intended to replace advice given to you by your health care provider. Make sure you discuss any questions you have with your health care provider. Document Released: 08/06/2005 Document Revised: 04/04/2016 Document Reviewed: 04/04/2016 Elsevier Interactive Patient Education  2017 [employer] Commonly Asked Questions During Pregnancy  Cats: A parasite can be excreted in cat feces.  To avoid exposure you need to have another person empty the little box.  If you must empty the litter box you will need to wear gloves.  Wash your hands after handling your cat.  This parasite can also be found in raw or undercooked meat so this should also be avoided.  Colds, Sore Throats, Flu: Please check your medication sheet to see what you can take for symptoms.  If your symptoms are unrelieved by these medications please call the office.  Dental Work: Most any dental work your dentist recommends is permitted.  X-rays should only be taken during the first trimester if absolutely necessary.  Your abdomen should be shielded with a lead apron during all x-rays.  Please notify your provider prior to receiving any x-rays.  Novocaine is fine; gas is not recommended.  If your dentist requires a note from us prior to dental work please call the office and we will provide one for you.  Exercise: Exercise is an important part of staying healthy during your pregnancy.  You may continue most exercises you were accustomed to prior to pregnancy.  Later in your pregnancy you will most likely notice you have difficulty  with activities requiring balance like riding a bicycle.  It is important that you listen to your body and avoid activities that put you at a higher risk of falling.  Adequate rest and staying well hydrated are a must!  If you have questions   about the safety of specific activities ask your provider.    Exposure to Children with illness: Try to avoid obvious exposure; report any symptoms to us when noted,  If you have chicken pos, red measles or mumps, you should be immune to these diseases.   Please do not take any vaccines while pregnant unless you have checked with your OB provider.  Fetal Movement: After 28 weeks we recommend you do "kick counts" twice daily.  Lie or sit down in a calm quiet environment and count your baby movements "kicks".  You should feel your baby at least 10 times per hour.  If you have not felt 10 kicks within the first hour get up, walk around and have something sweet to eat or drink then repeat for an additional hour.  If count remains less than 10 per hour notify your provider.  Fumigating: Follow your pest control agent's advice as to how long to stay out of your home.  Ventilate the area well before re-entering.  Hemorrhoids:   Most over-the-counter preparations can be used during pregnancy.  Check your medication to see what is safe to use.  It is important to use a stool softener or fiber in your diet and to drink lots of liquids.  If hemorrhoids seem to be getting worse please call the office.   Hot Tubs:  Hot tubs Jacuzzis and saunas are not recommended while pregnant.  These increase your internal body temperature and should be avoided.  Intercourse:  Sexual intercourse is safe during pregnancy as long as you are comfortable, unless otherwise advised by your provider.  Spotting may occur after intercourse; report any bright red bleeding that is heavier than spotting.  Labor:  If you know that you are in labor, please go to the hospital.  If you are unsure, please  call the office and let us help you decide what to do.  Lifting, straining, etc:  If your job requires heavy lifting or straining please check with your provider for any limitations.  Generally, you should not lift items heavier than that you can lift simply with your hands and arms (no back muscles)  Painting:  Paint fumes do not harm your pregnancy, but may make you ill and should be avoided if possible.  Latex or water based paints have less odor than oils.  Use adequate ventilation while painting.  Permanents & Hair Color:  Chemicals in hair dyes are not recommended as they cause increase hair dryness which can increase hair loss during pregnancy.  " Highlighting" and permanents are allowed.  Dye may be absorbed differently and permanents may not hold as well during pregnancy.  Sunbathing:  Use a sunscreen, as skin burns easily during pregnancy.  Drink plenty of fluids; avoid over heating.  Tanning Beds:  Because their possible side effects are still unknown, tanning beds are not recommended.  Ultrasound Scans:  Routine ultrasounds are performed at approximately 20 weeks.  You will be able to see your baby's general anatomy an if you would like to know the gender this can usually be determined as well.  If it is questionable when you conceived you may also receive an ultrasound early in your pregnancy for dating purposes.  Otherwise ultrasound exams are not routinely performed unless there is a medical necessity.  Although you can request a scan we ask that you pay for it when conducted because insurance does not cover " patient request" scans.  Work: If your pregnancy proceeds without complications you   may work until your due date, unless your physician or employer advises otherwise.  Round Ligament Pain/Pelvic Discomfort:  Sharp, shooting pains not associated with bleeding are fairly common, usually occurring in the second trimester of pregnancy.  They tend to be worse when standing up or when  you remain standing for long periods of time.  These are the result of pressure of certain pelvic ligaments called "round ligaments".  Rest, Tylenol and heat seem to be the most effective relief.  As the womb and fetus grow, they rise out of the pelvis and the discomfort improves.  Please notify the office if your pain seems different than that described.  It may represent a more serious condition.   

## 2016-09-12 LAB — HEPATITIS B SURFACE ANTIGEN: Hepatitis B Surface Ag: NEGATIVE

## 2016-09-12 LAB — CBC WITH DIFFERENTIAL/PLATELET
Basophils Absolute: 0 10*3/uL (ref 0.0–0.2)
Basos: 0 %
EOS (ABSOLUTE): 0 10*3/uL (ref 0.0–0.4)
EOS: 0 %
HEMATOCRIT: 39.6 % (ref 34.0–46.6)
HEMOGLOBIN: 13.3 g/dL (ref 11.1–15.9)
IMMATURE GRANULOCYTES: 0 %
Immature Grans (Abs): 0 10*3/uL (ref 0.0–0.1)
LYMPHS ABS: 2.5 10*3/uL (ref 0.7–3.1)
Lymphs: 31 %
MCH: 30.5 pg (ref 26.6–33.0)
MCHC: 33.6 g/dL (ref 31.5–35.7)
MCV: 91 fL (ref 79–97)
MONOCYTES: 8 %
Monocytes Absolute: 0.6 10*3/uL (ref 0.1–0.9)
Neutrophils Absolute: 5 10*3/uL (ref 1.4–7.0)
Neutrophils: 61 %
Platelets: 317 10*3/uL (ref 150–379)
RBC: 4.36 x10E6/uL (ref 3.77–5.28)
RDW: 12.7 % (ref 12.3–15.4)
WBC: 8.2 10*3/uL (ref 3.4–10.8)

## 2016-09-12 LAB — ABO

## 2016-09-12 LAB — VARICELLA ZOSTER ANTIBODY, IGG: Varicella zoster IgG: 2032 index (ref >165)

## 2016-09-12 LAB — ANTIBODY SCREEN: Antibody Screen: NEGATIVE

## 2016-09-12 LAB — RPR: RPR Ser Ql: NONREACTIVE

## 2016-09-12 LAB — RH TYPE: RH TYPE: POSITIVE

## 2016-09-12 LAB — HIV ANTIBODY (ROUTINE TESTING W REFLEX): HIV SCREEN 4TH GENERATION: NONREACTIVE

## 2016-09-12 LAB — RUBELLA SCREEN: Rubella Antibodies, IGG: 1.56 index (ref >0.99)

## 2016-09-13 LAB — CULTURE, OB URINE

## 2016-09-13 LAB — URINE CULTURE, OB REFLEX

## 2016-09-14 LAB — GC/CHLAMYDIA PROBE AMP
CHLAMYDIA, DNA PROBE: NEGATIVE
Neisseria gonorrhoeae by PCR: NEGATIVE

## 2016-09-19 LAB — URINALYSIS, ROUTINE W REFLEX MICROSCOPIC
Bilirubin, UA: NEGATIVE
Glucose, UA: NEGATIVE
Ketones, UA: NEGATIVE
Leukocytes, UA: NEGATIVE
Nitrite, UA: NEGATIVE
PH UA: 7 (ref 5.0–7.5)
PROTEIN UA: NEGATIVE
RBC UA: NEGATIVE
Specific Gravity, UA: 1.007 (ref 1.005–1.030)
UUROB: 0.2 mg/dL (ref 0.2–1.0)

## 2016-09-19 LAB — MONITOR DRUG PROFILE 14(MW)
Amphetamine Scrn, Ur: NEGATIVE ng/mL
BARBITURATE SCREEN URINE: NEGATIVE ng/mL
BENZODIAZEPINE SCREEN, URINE: NEGATIVE ng/mL
BUPRENORPHINE, URINE: NEGATIVE ng/mL
CANNABINOIDS UR QL SCN: NEGATIVE ng/mL
COCAINE(METAB.)SCREEN, URINE: NEGATIVE ng/mL
CREATININE(CRT), U: 32.1 mg/dL (ref 20.0–300.0)
Fentanyl, Urine: NEGATIVE pg/mL
MEPERIDINE SCREEN, URINE: NEGATIVE ng/mL
METHADONE SCREEN, URINE: NEGATIVE ng/mL
OPIATE SCREEN URINE: NEGATIVE ng/mL
OXYCODONE+OXYMORPHONE UR QL SCN: NEGATIVE ng/mL
PHENCYCLIDINE QUANTITATIVE URINE: NEGATIVE ng/mL
PROPOXYPHENE SCREEN URINE: NEGATIVE ng/mL
Ph of Urine: 6.5 (ref 4.5–8.9)
SPECIFIC GRAVITY: 1.008
TRAMADOL SCREEN, URINE: NEGATIVE ng/mL

## 2016-09-19 LAB — NICOTINE SCREEN, URINE: Cotinine Ql Scrn, Ur: NEGATIVE ng/mL

## 2016-09-21 ENCOUNTER — Other Ambulatory Visit: Payer: Self-pay | Admitting: *Deleted

## 2016-09-21 ENCOUNTER — Other Ambulatory Visit: Payer: 59

## 2016-09-21 DIAGNOSIS — Z3A09 9 weeks gestation of pregnancy: Secondary | ICD-10-CM

## 2016-09-29 LAB — MATERNIT 21 PLUS CORE, BLOOD
Chromosome 13: NEGATIVE
Chromosome 18: NEGATIVE
Chromosome 21: NEGATIVE
Y Chromosome: DETECTED

## 2016-10-02 ENCOUNTER — Other Ambulatory Visit: Payer: 59

## 2016-10-02 ENCOUNTER — Ambulatory Visit (INDEPENDENT_AMBULATORY_CARE_PROVIDER_SITE_OTHER): Payer: 59 | Admitting: Obstetrics and Gynecology

## 2016-10-02 ENCOUNTER — Encounter: Payer: Self-pay | Admitting: Obstetrics and Gynecology

## 2016-10-02 VITALS — BP 102/59 | HR 63 | Wt 144.4 lb

## 2016-10-02 DIAGNOSIS — M545 Low back pain, unspecified: Secondary | ICD-10-CM

## 2016-10-02 DIAGNOSIS — Z3481 Encounter for supervision of other normal pregnancy, first trimester: Secondary | ICD-10-CM

## 2016-10-02 LAB — POCT URINALYSIS DIPSTICK
Bilirubin, UA: NEGATIVE
GLUCOSE UA: NEGATIVE
Ketones, UA: NEGATIVE
LEUKOCYTES UA: NEGATIVE
NITRITE UA: NEGATIVE
Protein, UA: NEGATIVE
RBC UA: NEGATIVE
Spec Grav, UA: <=1.005
UROBILINOGEN UA: 0.2
pH, UA: 6.5

## 2016-10-02 NOTE — Patient Instructions (Signed)
Second Trimester of Pregnancy The second trimester is from week 13 through week 28 (months 4 through 6). The second trimester is often a time when you feel your best. Your body has also adjusted to being pregnant, and you begin to feel better physically. Usually, morning sickness has lessened or quit completely, you may have more energy, and you may have an increase in appetite. The second trimester is also a time when the fetus is growing rapidly. At the end of the sixth month, the fetus is about 9 inches long and weighs about 1 pounds. You will likely begin to feel the baby move (quickening) between 18 and 20 weeks of the pregnancy. Body changes during your second trimester Your body continues to go through many changes during your second trimester. The changes vary from woman to woman.  Your weight will continue to increase. You will notice your lower abdomen bulging out.  You may begin to get stretch marks on your hips, abdomen, and breasts.  You may develop headaches that can be relieved by medicines. The medicines should be approved by your health care provider.  You may urinate more often because the fetus is pressing on your bladder.  You may develop or continue to have heartburn as a result of your pregnancy.  You may develop constipation because certain hormones are causing the muscles that push waste through your intestines to slow down.  You may develop hemorrhoids or swollen, bulging veins (varicose veins).  You may have back pain. This is caused by:  Weight gain.  Pregnancy hormones that are relaxing the joints in your pelvis.  A shift in weight and the muscles that support your balance.  Your breasts will continue to grow and they will continue to become tender.  Your gums may bleed and may be sensitive to brushing and flossing.  Dark spots or blotches (chloasma, mask of pregnancy) may develop on your face. This will likely fade after the baby is born.  A dark line  from your belly button to the pubic area (linea nigra) may appear. This will likely fade after the baby is born.  You may have changes in your hair. These can include thickening of your hair, rapid growth, and changes in texture. Some women also have hair loss during or after pregnancy, or hair that feels dry or thin. Your hair will most likely return to normal after your baby is born. What to expect at prenatal visits During a routine prenatal visit:  You will be weighed to make sure you and the fetus are growing normally.  Your blood pressure will be taken.  Your abdomen will be measured to track your baby's growth.  The fetal heartbeat will be listened to.  Any test results from the previous visit will be discussed. Your health care provider may ask you:  How you are feeling.  If you are feeling the baby move.  If you have had any abnormal symptoms, such as leaking fluid, bleeding, severe headaches, or abdominal cramping.  If you are using any tobacco products, including cigarettes, chewing tobacco, and electronic cigarettes.  If you have any questions. Other tests that may be performed during your second trimester include:  Blood tests that check for:  Low iron levels (anemia).  Gestational diabetes (between 24 and 28 weeks).  Rh antibodies. This is to check for a protein on red blood cells (Rh factor).  Urine tests to check for infections, diabetes, or protein in the urine.  An ultrasound to   confirm the proper growth and development of the baby.  An amniocentesis to check for possible genetic problems.  Fetal screens for spina bifida and Down syndrome.  HIV (human immunodeficiency virus) testing. Routine prenatal testing includes screening for HIV, unless you choose not to have this test. Follow these instructions at home: Eating and drinking  Continue to eat regular, healthy meals.  Avoid raw meat, uncooked cheese, cat litter boxes, and soil used by cats. These  carry germs that can cause birth defects in the baby.  Take your prenatal vitamins.  Take 1500-2000 mg of calcium daily starting at the 20th week of pregnancy until you deliver your baby.  If you develop constipation:  Take over-the-counter or prescription medicines.  Drink enough fluid to keep your urine clear or pale yellow.  Eat foods that are high in fiber, such as fresh fruits and vegetables, whole grains, and beans.  Limit foods that are high in fat and processed sugars, such as fried and sweet foods. Activity  Exercise only as directed by your health care provider. Experiencing uterine cramps is a good sign to stop exercising.  Avoid heavy lifting, wear low heel shoes, and practice good posture.  Wear your seat belt at all times when driving.  Rest with your legs elevated if you have leg cramps or low back pain.  Wear a good support bra for breast tenderness.  Do not use hot tubs, steam rooms, or saunas. Lifestyle  Avoid all smoking, herbs, alcohol, and unprescribed drugs. These chemicals affect the formation and growth of the baby.  Do not use any products that contain nicotine or tobacco, such as cigarettes and e-cigarettes. If you need help quitting, ask your health care provider.  A sexual relationship may be continued unless your health care provider directs you otherwise. General instructions  Follow your health care provider's instructions regarding medicine use. There are medicines that are either safe or unsafe to take during pregnancy.  Take warm sitz baths to soothe any pain or discomfort caused by hemorrhoids. Use hemorrhoid cream if your health care provider approves.  If you develop varicose veins, wear support hose. Elevate your feet for 15 minutes, 3-4 times a day. Limit salt in your diet.  Visit your dentist if you have not gone yet during your pregnancy. Use a soft toothbrush to brush your teeth and be gentle when you floss.  Keep all follow-up  prenatal visits as told by your health care provider. This is important. Contact a health care provider if:  You have dizziness.  You have mild pelvic cramps, pelvic pressure, or nagging pain in the abdominal area.  You have persistent nausea, vomiting, or diarrhea.  You have a bad smelling vaginal discharge.  You have pain with urination. Get help right away if:  You have a fever.  You are leaking fluid from your vagina.  You have spotting or bleeding from your vagina.  You have severe abdominal cramping or pain.  You have rapid weight gain or weight loss.  You have shortness of breath with chest pain.  You notice sudden or extreme swelling of your face, hands, ankles, feet, or legs.  You have not felt your baby move in over an hour.  You have severe headaches that do not go away with medicine.  You have vision changes. Summary  The second trimester is from week 13 through week 28 (months 4 through 6). It is also a time when the fetus is growing rapidly.  Your body goes   through many changes during pregnancy. The changes vary from woman to woman.  Avoid all smoking, herbs, alcohol, and unprescribed drugs. These chemicals affect the formation and growth your baby.  Do not use any tobacco products, such as cigarettes, chewing tobacco, and e-cigarettes. If you need help quitting, ask your health care provider.  Contact your health care provider if you have any questions. Keep all prenatal visits as told by your health care provider. This is important. This information is not intended to replace advice given to you by your health care provider. Make sure you discuss any questions you have with your health care provider. Document Released: 07/31/2001 Document Revised: 01/12/2016 Document Reviewed: 10/07/2012 Elsevier Interactive Patient Education  2017 [employer]  

## 2016-10-02 NOTE — Progress Notes (Signed)
ROB- pt is having B hip pain, low back pain, otherwise she is doing well

## 2016-10-02 NOTE — Progress Notes (Signed)
NEW OB HISTORY AND PHYSICAL  SUBJECTIVE:       Sara Webb is a 26 y.o. [phone] female, Patient's last menstrual period was 07/14/2016 (approximate)., Estimated Date of Delivery: 04/20/17, [redacted]w[redacted]d, presents today for establishment of Prenatal Care. She has no unusual complaints and complains of backache      Gynecologic History Patient's last menstrual period was 07/14/2016 (approximate). Normal Contraception: none Last Pap: 2017. Results were: normal  Obstetric History OB History  Gravida Para Term Preterm AB Living  4 2 2   1 2   SAB TAB Ectopic Multiple Live Births      1   2    # Outcome Date GA Lbr Len/2nd Weight Sex Delivery Anes PTL Lv  4 Current           3 Ectopic 2015        ND  2 Term     M Vag-Spont   LIV  1 Term     M Vag-Spont   LIV      Past Medical History:  Diagnosis Date  . migraine     Past Surgical History:  Procedure Laterality Date  . LAPAROSCOPIC OVARIAN CYSTECTOMY Right 05/18/2016   Procedure: LAPAROSCOPIC OVARIAN CYSTECTOMY;  Surgeon: Nadara Mustard, MD;  Location: [facility] ORS;  Service: Gynecology;  Laterality: Right;  . NO PAST SURGERIES      Current Outpatient Prescriptions on File Prior to Visit  Medication Sig Dispense Refill  . Prenatal Vit-Fe Fumarate-FA (MULTIVITAMIN-PRENATAL) 27-0.8 MG TABS tablet Take 1 tablet by mouth daily at 12 noon.     No current facility-administered medications on file prior to visit.     Allergies  Allergen Reactions  . Benadryl [Diphenhydramine Hcl (Sleep)] Other (See Comments)    Hyperactive and fast heart rate  . Sulfa Antibiotics     Social History   Social History  . Marital status: Married    Spouse name: N/A  . Number of children: N/A  . Years of education: N/A   Occupational History  . Not on file.   Social History Main Topics  . Smoking status: Never Smoker  . Smokeless tobacco: Never Used  . Alcohol use No  . Drug use: No  . Sexual activity: Yes    Birth control/ protection: None    Other Topics Concern  . Not on file   Social History Narrative  . No narrative on file    Family History  Problem Relation Age of Onset  . Cancer Paternal Aunt     melanoma    The following portions of the patient's history were reviewed and updated as appropriate: allergies, current medications, past OB history, past medical history, past surgical history, past family history, past social history, and problem list.    OBJECTIVE: Initial Physical Exam (New OB)  GENERAL APPEARANCE: alert, well appearing, in no apparent distress, oriented to person, place and time HEAD: normocephalic, atraumatic MOUTH: mucous membranes moist, pharynx normal without lesions and dental hygiene good THYROID: no thyromegaly or masses present BREASTS: not examined LUNGS: clear to auscultation, no wheezes, rales or rhonchi, symmetric air entry HEART: regular rate and rhythm, no murmurs ABDOMEN: soft, nontender, nondistended, no abnormal masses, no epigastric pain, fundus not palpable and FHT present EXTREMITIES: no redness or tenderness in the calves or thighs, no limitation in range of motion SKIN: normal coloration and turgor, no rashes LYMPH NODES: no adenopathy palpable NEUROLOGIC: alert, oriented, normal speech, no focal findings or movement disorder noted, not examined  PELVIC EXAM not indicated  ASSESSMENT: Normal pregnancy Low back pain   PLAN: Prenatal care See orders

## 2016-10-19 ENCOUNTER — Encounter: Payer: 59 | Admitting: Obstetrics and Gynecology

## 2016-10-30 ENCOUNTER — Ambulatory Visit (INDEPENDENT_AMBULATORY_CARE_PROVIDER_SITE_OTHER): Payer: 59 | Admitting: Obstetrics and Gynecology

## 2016-10-30 VITALS — BP 108/62 | HR 74 | Wt 144.4 lb

## 2016-10-30 DIAGNOSIS — J0111 Acute recurrent frontal sinusitis: Secondary | ICD-10-CM

## 2016-10-30 DIAGNOSIS — I8390 Asymptomatic varicose veins of unspecified lower extremity: Secondary | ICD-10-CM

## 2016-10-30 DIAGNOSIS — Z3492 Encounter for supervision of normal pregnancy, unspecified, second trimester: Secondary | ICD-10-CM

## 2016-10-30 LAB — POCT URINALYSIS DIPSTICK
BILIRUBIN UA: NEGATIVE
Blood, UA: NEGATIVE
Glucose, UA: NEGATIVE
KETONES UA: NEGATIVE
LEUKOCYTES UA: NEGATIVE
Nitrite, UA: NEGATIVE
PH UA: 6
Protein, UA: NEGATIVE
SPEC GRAV UA: 1.01
Urobilinogen, UA: 0.2

## 2016-10-30 NOTE — Progress Notes (Signed)
ROB-on second day of Zpac- feeling just a little better, tylenol not helping- Ok to use a few doses of motrin. Recommend claritin and spray/nasal spray or netty pot daily through spring. Anatomy scan next [email or web address] [email or web address] vein in right calf- recommend compression socks.

## 2016-10-30 NOTE — Progress Notes (Signed)
ROB- pt is taking a zpak due to sinus inf, otherwise she is feeling good

## 2016-11-27 ENCOUNTER — Ambulatory Visit (INDEPENDENT_AMBULATORY_CARE_PROVIDER_SITE_OTHER): Payer: 59 | Admitting: Certified Nurse Midwife

## 2016-11-27 ENCOUNTER — Ambulatory Visit (INDEPENDENT_AMBULATORY_CARE_PROVIDER_SITE_OTHER): Payer: 59

## 2016-11-27 VITALS — BP 104/62 | HR 62 | Wt 152.0 lb

## 2016-11-27 DIAGNOSIS — O35EXX0 Maternal care for other (suspected) fetal abnormality and damage, fetal genitourinary anomalies, not applicable or unspecified: Secondary | ICD-10-CM

## 2016-11-27 DIAGNOSIS — O358XX0 Maternal care for other (suspected) fetal abnormality and damage, not applicable or unspecified: Secondary | ICD-10-CM

## 2016-11-27 DIAGNOSIS — Z3492 Encounter for supervision of normal pregnancy, unspecified, second trimester: Secondary | ICD-10-CM | POA: Diagnosis not present

## 2016-11-27 LAB — POCT URINALYSIS DIPSTICK
BILIRUBIN UA: NEGATIVE
Blood, UA: NEGATIVE
Glucose, UA: NEGATIVE
KETONES UA: NEGATIVE
Leukocytes, UA: NEGATIVE
Nitrite, UA: NEGATIVE
Protein, UA: NEGATIVE
SPEC GRAV UA: 1.01 (ref 1.030–1.035)
Urobilinogen, UA: NEGATIVE (ref ?–2.0)
pH, UA: 6 (ref 5.0–8.0)

## 2016-11-27 NOTE — Progress Notes (Signed)
ROB-Pt doing well. Discussed safe exercise in pregnancy.  Examined varicose vein in lower right leg, no change in size. Denies pain, warmth or tenderness to site. Encouraged use of abdominal support and compression garments. Reviewed US findings, incomplete anatomy and left dilated fetal renal pelvis. RTC x 1-2 weeks for follow up anatomy scan. Repeat US at 28 weeks to f/u fetal DRP. Reviewed red flag symptoms and when to call. RTC x 4-5 weeks for ROB.   ULTRASOUND REPORT  Location: [facility] Date of Service: 11/27/16  Indications:Anatomy U/S Findings:  Sara Webb intrauterine pregnancy is visualized with FHR at 145 BPM. Biometrics give an (U/S) Gestational age of [redacted] weeks and an (U/S) EDD of 04/16/17; this correlates with the clinically established EDD of 04/18/17.  Fetal presentation is Vertex.  EFW: 331g (12 oz). Placenta: Anterior, Grade 0, 6.5 cm from internal os. AFI: Adequate with MVP of 4.5.  Anatomic survey is incomplete and normal; Gender - female  . Spine not seen due to fetal position.  Mildly dilated renal pelvis in left kidney measures 4 mm. All other anatomy appears normal.  Ovaries are not seen Survey of the adnexa demonstrates no adnexal masses. There is no free peritoneal fluid in the cul de sac.  Impression: 1. 20 week Viable Singleton Intrauterine pregnancy by U/S. 2. (U/S) EDD is consistent with Clinically established (LMP) EDD of 04/18/17. 3. Normal Anatomy Scan  Recommendations: 1.Clinical correlation with the patient's History and Physical Exam. 2. Return in 1-2 weeks to FU anatomy not seen on today's exam. 3. Return at 28 weeks to follow up left dilated renal pelvis

## 2016-12-06 ENCOUNTER — Telehealth: Payer: Self-pay | Admitting: Certified Nurse Midwife

## 2016-12-06 ENCOUNTER — Ambulatory Visit (INDEPENDENT_AMBULATORY_CARE_PROVIDER_SITE_OTHER): Payer: 59

## 2016-12-06 DIAGNOSIS — Z3492 Encounter for supervision of normal pregnancy, unspecified, second trimester: Secondary | ICD-10-CM

## 2016-12-06 NOTE — Telephone Encounter (Signed)
Patient is scheduled for another FU anatomy scan on 12/25/2016 Byrd Hesselbach couldn't see the spine due to the baby turning. Larraine spoke with her insurance company and she will be responsible for a portion of the scan so she wants to know if she can wait until she has her 28 week scan? Please call

## 2016-12-10 ENCOUNTER — Encounter: Payer: Self-pay | Admitting: Certified Nurse Midwife

## 2016-12-25 ENCOUNTER — Ambulatory Visit (INDEPENDENT_AMBULATORY_CARE_PROVIDER_SITE_OTHER): Payer: 59 | Admitting: Certified Nurse Midwife

## 2016-12-25 ENCOUNTER — Encounter: Payer: Self-pay | Admitting: Certified Nurse Midwife

## 2016-12-25 ENCOUNTER — Other Ambulatory Visit: Payer: 59

## 2016-12-25 VITALS — BP 104/61 | HR 70 | Wt 159.9 lb

## 2016-12-25 DIAGNOSIS — Z3482 Encounter for supervision of other normal pregnancy, second trimester: Secondary | ICD-10-CM

## 2016-12-25 DIAGNOSIS — Z3490 Encounter for supervision of normal pregnancy, unspecified, unspecified trimester: Secondary | ICD-10-CM | POA: Insufficient documentation

## 2016-12-25 DIAGNOSIS — Z3A23 23 weeks gestation of pregnancy: Secondary | ICD-10-CM

## 2016-12-25 LAB — POCT URINALYSIS DIPSTICK
Bilirubin, UA: NEGATIVE
GLUCOSE UA: NEGATIVE
Ketones, UA: NEGATIVE
Leukocytes, UA: NEGATIVE
NITRITE UA: NEGATIVE
PROTEIN UA: NEGATIVE
RBC UA: NEGATIVE
Spec Grav, UA: 1.02 (ref 1.010–1.025)
UROBILINOGEN UA: 0.2 U/dL
pH, UA: 6 (ref 5.0–8.0)

## 2016-12-25 NOTE — Patient Instructions (Signed)

## 2016-12-25 NOTE — Progress Notes (Signed)
ROB doing well. Denies LOF, vaginal bleeding, and contractions. Endorses good movement. She states she had a migraine on Sunday. She has a history of migraines states that she has found that they can be triggered by food. She states that she is going to try to avoid her food triggers. Encouraged PO hydration. ROB in 4 wks with repeat ultrasound , 1 hr GTT, and ROB.   Doreene BurkeAnnie Tayloranne Lekas, CNM

## 2017-01-25 ENCOUNTER — Other Ambulatory Visit: Payer: Self-pay | Admitting: Certified Nurse Midwife

## 2017-01-25 DIAGNOSIS — IMO0002 Reserved for concepts with insufficient information to code with codable children: Secondary | ICD-10-CM

## 2017-01-25 DIAGNOSIS — Z0489 Encounter for examination and observation for other specified reasons: Secondary | ICD-10-CM

## 2017-01-29 ENCOUNTER — Ambulatory Visit (INDEPENDENT_AMBULATORY_CARE_PROVIDER_SITE_OTHER): Payer: 59 | Admitting: Obstetrics and Gynecology

## 2017-01-29 ENCOUNTER — Ambulatory Visit: Payer: 59

## 2017-01-29 VITALS — BP 108/77 | HR 87 | Wt 165.4 lb

## 2017-01-29 DIAGNOSIS — Z3493 Encounter for supervision of normal pregnancy, unspecified, third trimester: Secondary | ICD-10-CM | POA: Diagnosis not present

## 2017-01-29 DIAGNOSIS — Z23 Encounter for immunization: Secondary | ICD-10-CM

## 2017-01-29 DIAGNOSIS — IMO0002 Reserved for concepts with insufficient information to code with codable children: Secondary | ICD-10-CM

## 2017-01-29 DIAGNOSIS — Z0489 Encounter for examination and observation for other specified reasons: Secondary | ICD-10-CM

## 2017-01-29 DIAGNOSIS — Z131 Encounter for screening for diabetes mellitus: Secondary | ICD-10-CM

## 2017-01-29 LAB — POCT URINALYSIS DIPSTICK
Bilirubin, UA: NEGATIVE
Blood, UA: NEGATIVE
Glucose, UA: NEGATIVE
Ketones, UA: NEGATIVE
LEUKOCYTES UA: NEGATIVE
Nitrite, UA: NEGATIVE
PH UA: 6.5 (ref 5.0–8.0)
PROTEIN UA: NEGATIVE
Spec Grav, UA: 1.01 (ref 1.010–1.025)
UROBILINOGEN UA: 0.2 U/dL

## 2017-01-29 MED ORDER — TETANUS-DIPHTH-ACELL PERTUSSIS 5-2.5-18.5 LF-MCG/0.5 IM SUSP
0.5000 mL | Freq: Once | INTRAMUSCULAR | Status: AC
  Administered 2017-01-29: 0.5 mL via INTRAMUSCULAR

## 2017-01-29 NOTE — Progress Notes (Signed)
ROB & glucola and f/u anatomy- dilated renal pelvis resolved.   Indications:F/U Anatomy Findings:  Sara JimSingleton intrauterine pregnancy is visualized with FHR at 143 BPM. Biometrics give an (U/S) Gestational age of 28 6/7 weeks and an (U/S) EDD of 04/17/17; this correlates with the clinically established EDD of 04/18/17.  Fetal presentation is Vertex.  EFW: 1239g (2lb 12oz) Williams 45th percentile. Placenta: Anterior, grade 0, remote to cervix. AFI: 12.8.  Anatomy is complete with the acquisition of the spine.  Spine, Stomach, bladder and kidneys appear normal  Impression: 1.  28 6/7 week Viable Singleton Intrauterine pregnancy by U/S. 2. (U/S) EDD is consistent with Clinically established (LMP) EDD of 04/18/17. 3. Complete Anatomy and adequate Growth

## 2017-01-29 NOTE — Patient Instructions (Signed)
Third Trimester of Pregnancy The third trimester is from week 28 through week 40 (months 7 through 9). The third trimester is a time when the unborn baby (fetus) is growing rapidly. At the end of the ninth month, the fetus is about 20 inches in length and weighs 6-10 pounds. Body changes during your third trimester Your body will continue to go through many changes during pregnancy. The changes vary from woman to woman. During the third trimester:  Your weight will continue to increase. You can expect to gain 25-35 pounds (11-16 kg) by the end of the pregnancy.  You may begin to get stretch marks on your hips, abdomen, and breasts.  You may urinate more often because the fetus is moving lower into your pelvis and pressing on your bladder.  You may develop or continue to have heartburn. This is caused by increased hormones that slow down muscles in the digestive tract.  You may develop or continue to have constipation because increased hormones slow digestion and cause the muscles that push waste through your intestines to relax.  You may develop hemorrhoids. These are swollen veins (varicose veins) in the rectum that can itch or be painful.  You may develop swollen, bulging veins (varicose veins) in your legs.  You may have increased body aches in the pelvis, back, or thighs. This is due to weight gain and increased hormones that are relaxing your joints.  You may have changes in your hair. These can include thickening of your hair, rapid growth, and changes in texture. Some women also have hair loss during or after pregnancy, or hair that feels dry or thin. Your hair will most likely return to normal after your baby is born.  Your breasts will continue to grow and they will continue to become tender. A yellow fluid (colostrum) may leak from your breasts. This is the first milk you are producing for your baby.  Your belly button may stick out.  You may notice more swelling in your hands,  face, or ankles.  You may have increased tingling or numbness in your hands, arms, and legs. The skin on your belly may also feel numb.  You may feel short of breath because of your expanding uterus.  You may have more problems sleeping. This can be caused by the size of your belly, increased need to urinate, and an increase in your body's metabolism.  You may notice the fetus "dropping," or moving lower in your abdomen (lightening).  You may have increased vaginal discharge.  You may notice your joints feel loose and you may have pain around your pelvic bone.  What to expect at prenatal visits You will have prenatal exams every 2 weeks until week 36. Then you will have weekly prenatal exams. During a routine prenatal visit:  You will be weighed to make sure you and the baby are growing normally.  Your blood pressure will be taken.  Your abdomen will be measured to track your baby's growth.  The fetal heartbeat will be listened to.  Any test results from the previous visit will be discussed.  You may have a cervical check near your due date to see if your cervix has softened or thinned (effaced).  You will be tested for Group B streptococcus. This happens between 35 and 37 weeks.  Your health care provider may ask you:  What your birth plan is.  How you are feeling.  If you are feeling the baby move.  If you have had   any abnormal symptoms, such as leaking fluid, bleeding, severe headaches, or abdominal cramping.  If you are using any tobacco products, including cigarettes, chewing tobacco, and electronic cigarettes.  If you have any questions.  Other tests or screenings that may be performed during your third trimester include:  Blood tests that check for low iron levels (anemia).  Fetal testing to check the health, activity level, and growth of the fetus. Testing is done if you have certain medical conditions or if there are problems during the  pregnancy.  Nonstress test (NST). This test checks the health of your baby to make sure there are no signs of problems, such as the baby not getting enough oxygen. During this test, a belt is placed around your belly. The baby is made to move, and its heart rate is monitored during movement.  What is false labor? False labor is a condition in which you feel small, irregular tightenings of the muscles in the womb (contractions) that usually go away with rest, changing position, or drinking water. These are called Braxton Hicks contractions. Contractions may last for hours, days, or even weeks before true labor sets in. If contractions come at regular intervals, become more frequent, increase in intensity, or become painful, you should see your health care provider. What are the signs of labor?  Abdominal cramps.  Regular contractions that start at 10 minutes apart and become stronger and more frequent with time.  Contractions that start on the top of the uterus and spread down to the lower abdomen and back.  Increased pelvic pressure and dull back pain.  A watery or bloody mucus discharge that comes from the vagina.  Leaking of amniotic fluid. This is also known as your "water breaking." It could be a slow trickle or a gush. Let your health care provider know if it has a color or strange odor. If you have any of these signs, call your health care provider right away, even if it is before your due date. Follow these instructions at home: Medicines  Follow your health care provider's instructions regarding medicine use. Specific medicines may be either safe or unsafe to take during pregnancy.  Take a prenatal vitamin that contains at least 600 micrograms (mcg) of folic acid.  If you develop constipation, try taking a stool softener if your health care provider approves. Eating and drinking  Eat a balanced diet that includes fresh fruits and vegetables, whole grains, good sources of protein  such as meat, eggs, or tofu, and low-fat dairy. Your health care provider will help you determine the amount of weight gain that is right for you.  Avoid raw meat and uncooked cheese. These carry germs that can cause birth defects in the baby.  If you have low calcium intake from food, talk to your health care provider about whether you should take a daily calcium supplement.  Eat four or five small meals rather than three large meals a day.  Limit foods that are high in fat and processed sugars, such as fried and sweet foods.  To prevent constipation: ? Drink enough fluid to keep your urine clear or pale yellow. ? Eat foods that are high in fiber, such as fresh fruits and vegetables, whole grains, and beans. Activity  Exercise only as directed by your health care provider. Most women can continue their usual exercise routine during pregnancy. Try to exercise for 30 minutes at least 5 days a week. Stop exercising if you experience uterine contractions.  Avoid heavy   lifting.  Do not exercise in extreme heat or humidity, or at high altitudes.  Wear low-heel, comfortable shoes.  Practice good posture.  You may continue to have sex unless your health care provider tells you otherwise. Relieving pain and discomfort  Take frequent breaks and rest with your legs elevated if you have leg cramps or low back pain.  Take warm sitz baths to soothe any pain or discomfort caused by hemorrhoids. Use hemorrhoid cream if your health care provider approves.  Wear a good support bra to prevent discomfort from breast tenderness.  If you develop varicose veins: ? Wear support pantyhose or compression stockings as told by your healthcare provider. ? Elevate your feet for 15 minutes, 3-4 times a day. Prenatal care  Write down your questions. Take them to your prenatal visits.  Keep all your prenatal visits as told by your health care provider. This is important. Safety  Wear your seat belt at  all times when driving.  Make a list of emergency phone numbers, including numbers for family, friends, the hospital, and police and fire departments. General instructions  Avoid cat litter boxes and soil used by cats. These carry germs that can cause birth defects in the baby. If you have a cat, ask someone to clean the litter box for you.  Do not travel far distances unless it is absolutely necessary and only with the approval of your health care provider.  Do not use hot tubs, steam rooms, or saunas.  Do not drink alcohol.  Do not use any products that contain nicotine or tobacco, such as cigarettes and e-cigarettes. If you need help quitting, ask your health care provider.  Do not use any medicinal herbs or unprescribed drugs. These chemicals affect the formation and growth of the baby.  Do not douche or use tampons or scented sanitary pads.  Do not cross your legs for long periods of time.  To prepare for the arrival of your baby: ? Take prenatal classes to understand, practice, and ask questions about labor and delivery. ? Make a trial run to the hospital. ? Visit the hospital and tour the maternity area. ? Arrange for maternity or paternity leave through employers. ? Arrange for family and friends to take care of pets while you are in the hospital. ? Purchase a rear-facing car seat and make sure you know how to install it in your car. ? Pack your hospital bag. ? Prepare the baby's nursery. Make sure to remove all pillows and stuffed animals from the baby's crib to prevent suffocation.  Visit your dentist if you have not gone during your pregnancy. Use a soft toothbrush to brush your teeth and be gentle when you floss. Contact a health care provider if:  You are unsure if you are in labor or if your water has broken.  You become dizzy.  You have mild pelvic cramps, pelvic pressure, or nagging pain in your abdominal area.  You have lower back pain.  You have persistent  nausea, vomiting, or diarrhea.  You have an unusual or bad smelling vaginal discharge.  You have pain when you urinate. Get help right away if:  Your water breaks before 37 weeks.  You have regular contractions less than 5 minutes apart before 37 weeks.  You have a fever.  You are leaking fluid from your vagina.  You have spotting or bleeding from your vagina.  You have severe abdominal pain or cramping.  You have rapid weight loss or weight gain.    You have shortness of breath with chest pain.  You notice sudden or extreme swelling of your face, hands, ankles, feet, or legs.  Your baby makes fewer than 10 movements in 2 hours.  You have severe headaches that do not go away when you take medicine.  You have vision changes. Summary  The third trimester is from week 28 through week 40, months 7 through 9. The third trimester is a time when the unborn baby (fetus) is growing rapidly.  During the third trimester, your discomfort may increase as you and your baby continue to gain weight. You may have abdominal, leg, and back pain, sleeping problems, and an increased need to urinate.  During the third trimester your breasts will keep growing and they will continue to become tender. A yellow fluid (colostrum) may leak from your breasts. This is the first milk you are producing for your baby.  False labor is a condition in which you feel small, irregular tightenings of the muscles in the womb (contractions) that eventually go away. These are called Braxton Hicks contractions. Contractions may last for hours, days, or even weeks before true labor sets in.  Signs of labor can include: abdominal cramps; regular contractions that start at 10 minutes apart and become stronger and more frequent with time; watery or bloody mucus discharge that comes from the vagina; increased pelvic pressure and dull back pain; and leaking of amniotic fluid. This information is not intended to replace advice  given to you by your health care provider. Make sure you discuss any questions you have with your health care provider. Document Released: 07/31/2001 Document Revised: 01/12/2016 Document Reviewed: 10/07/2012 Elsevier Interactive Patient Education  2017 [employer]  

## 2017-01-29 NOTE — Progress Notes (Signed)
ROB- blood consent signed, glucola done, tdap given Pt is doing well 

## 2017-01-30 LAB — HEMOGLOBIN AND HEMATOCRIT, BLOOD
HEMOGLOBIN: 12.1 g/dL (ref 11.1–15.9)
Hematocrit: 35.4 % (ref 34.0–46.6)

## 2017-01-30 LAB — GLUCOSE, 1 HOUR GESTATIONAL: GESTATIONAL DIABETES SCREEN: 80 mg/dL (ref 65–139)

## 2017-02-19 ENCOUNTER — Ambulatory Visit (INDEPENDENT_AMBULATORY_CARE_PROVIDER_SITE_OTHER): Payer: 59 | Admitting: Certified Nurse Midwife

## 2017-02-19 ENCOUNTER — Encounter: Payer: Self-pay | Admitting: Certified Nurse Midwife

## 2017-02-19 VITALS — BP 98/56 | HR 75 | Wt 170.1 lb

## 2017-02-19 DIAGNOSIS — Z3493 Encounter for supervision of normal pregnancy, unspecified, third trimester: Secondary | ICD-10-CM

## 2017-02-19 LAB — POCT URINALYSIS DIPSTICK
Bilirubin, UA: NEGATIVE
Glucose, UA: NEGATIVE
Ketones, UA: NEGATIVE
NITRITE UA: NEGATIVE
PH UA: 8.5 — AB (ref 5.0–8.0)
RBC UA: NEGATIVE
Spec Grav, UA: 1.01 (ref 1.010–1.025)
Urobilinogen, UA: 0.2 E.U./dL

## 2017-02-19 NOTE — Progress Notes (Signed)
ROB- Needs health screen from completed for insurance. Pain under rt breast. X 2 days. Sharp pain. Feels like she can not take a deep breath.

## 2017-02-19 NOTE — Progress Notes (Signed)
ROB, doing well. No complaints. Discussed prenatal classes, cord blood donation, and birth plan. Denies LOF, vag bleeding and contractions. Reviewed fetal movement. Follow up in 2 wks.    Doreene BurkeAnnie Kayvion Arneson, CNM

## 2017-02-19 NOTE — Patient Instructions (Signed)

## 2017-02-20 LAB — LP+CREAT+HB A1C
CREATININE: 0.53 mg/dL — AB (ref 0.57–1.00)
Chol/HDL Ratio: 3.3 ratio (ref 0.0–4.4)
Cholesterol, Total: 293 mg/dL — ABNORMAL HIGH (ref 100–199)
GFR calc Af Amer: 149 mL/min/{1.73_m2} (ref >59)
GFR, EST NON AFRICAN AMERICAN: 130 mL/min/{1.73_m2} (ref >59)
HDL: 89 mg/dL (ref >39)
Hgb A1c MFr Bld: 4.8 % (ref 4.8–5.6)
LDL CALC: 156 mg/dL — AB (ref 0–99)
TRIGLYCERIDES: 242 mg/dL — AB (ref 0–149)
VLDL Cholesterol Cal: 48 mg/dL — ABNORMAL HIGH (ref 5–40)

## 2017-02-20 LAB — GLUCOSE, RANDOM: GLUCOSE: 71 mg/dL (ref 65–99)

## 2017-02-22 ENCOUNTER — Encounter: Payer: Self-pay | Admitting: Certified Nurse Midwife

## 2017-03-05 ENCOUNTER — Encounter: Payer: Self-pay | Admitting: Certified Nurse Midwife

## 2017-03-05 ENCOUNTER — Ambulatory Visit (INDEPENDENT_AMBULATORY_CARE_PROVIDER_SITE_OTHER): Payer: 59 | Admitting: Certified Nurse Midwife

## 2017-03-05 VITALS — BP 99/66 | HR 69 | Wt 171.7 lb

## 2017-03-05 DIAGNOSIS — Z3493 Encounter for supervision of normal pregnancy, unspecified, third trimester: Secondary | ICD-10-CM

## 2017-03-05 LAB — POCT URINALYSIS DIPSTICK
BILIRUBIN UA: NEGATIVE
Glucose, UA: NEGATIVE
KETONES UA: NEGATIVE
Leukocytes, UA: NEGATIVE
Nitrite, UA: NEGATIVE
PH UA: 8 (ref 5.0–8.0)
PROTEIN UA: NEGATIVE
RBC UA: NEGATIVE
SPEC GRAV UA: 1.02 (ref 1.010–1.025)
Urobilinogen, UA: 0.2 E.U./dL

## 2017-03-05 NOTE — Progress Notes (Signed)
ROB-Pt doing well, reports round ligament pain at end of day. Discussed home treatment measures including use of rebozo and abdominal support. Reviewed red flag symptoms and when to call. RTC x 2-3 weeks for 36 week cultures and ROB.

## 2017-03-05 NOTE — Patient Instructions (Signed)

## 2017-03-21 ENCOUNTER — Encounter: Payer: 59 | Admitting: Certified Nurse Midwife

## 2017-03-25 ENCOUNTER — Ambulatory Visit (INDEPENDENT_AMBULATORY_CARE_PROVIDER_SITE_OTHER): Payer: 59 | Admitting: Certified Nurse Midwife

## 2017-03-25 VITALS — BP 120/76 | HR 77 | Wt 176.5 lb

## 2017-03-25 DIAGNOSIS — Z3493 Encounter for supervision of normal pregnancy, unspecified, third trimester: Secondary | ICD-10-CM

## 2017-03-25 DIAGNOSIS — Z113 Encounter for screening for infections with a predominantly sexual mode of transmission: Secondary | ICD-10-CM

## 2017-03-25 DIAGNOSIS — Z3685 Encounter for antenatal screening for Streptococcus B: Secondary | ICD-10-CM

## 2017-03-25 LAB — POCT URINALYSIS DIPSTICK
BILIRUBIN UA: NEGATIVE
Blood, UA: NEGATIVE
GLUCOSE UA: NEGATIVE
Ketones, UA: NEGATIVE
Leukocytes, UA: NEGATIVE
Nitrite, UA: NEGATIVE
PH UA: 6.5 (ref 5.0–8.0)
Protein, UA: NEGATIVE
Spec Grav, UA: 1.01 (ref 1.010–1.025)
Urobilinogen, UA: 0.2 E.U./dL

## 2017-03-25 NOTE — Patient Instructions (Signed)
Vaginal Delivery Vaginal delivery means that you will give birth by pushing your baby out of your birth canal (vagina). A team of health care providers will help you before, during, and after vaginal delivery. Birth experiences are unique for every woman and every pregnancy, and birth experiences vary depending on where you choose to give birth. What should I do to prepare for my baby's birth? Before your baby is born, it is important to talk with your health care provider about:  Your labor and delivery preferences. These may include: ? Medicines that you may be given. ? How you will manage your pain. This might include non-medical pain relief techniques or injectable pain relief such as epidural analgesia. ? How you and your baby will be monitored during labor and delivery. ? Who may be in the labor and delivery room with you. ? Your feelings about surgical delivery of your baby (cesarean delivery, or C-section) if this becomes necessary. ? Your feelings about receiving donated blood through an IV tube (blood transfusion) if this becomes necessary.  Whether you are able: ? To take pictures or videos of the birth. ? To eat during labor and delivery. ? To move around, walk, or change positions during labor and delivery.  What to expect after your baby is born, such as: ? Whether delayed umbilical cord clamping and cutting is offered. ? Who will care for your baby right after birth. ? Medicines or tests that may be recommended for your baby. ? Whether breastfeeding is supported in your hospital or birth center. ? How long you will be in the hospital or birth center.  How any medical conditions you have may affect your baby or your labor and delivery experience.  To prepare for your baby's birth, you should also:  Attend all of your health care visits before delivery (prenatal visits) as recommended by your health care provider. This is important.  Prepare your home for your baby's  arrival. Make sure that you have: ? Diapers. ? Baby clothing. ? Feeding equipment. ? Safe sleeping arrangements for you and your baby.  Install a car seat in your vehicle. Have your car seat checked by a certified car seat installer to make sure that it is installed safely.  Think about who will help you with your new baby at home for at least the first several weeks after delivery.  What can I expect when I arrive at the birth center or hospital? Once you are in labor and have been admitted into the hospital or birth center, your health care provider may:  Review your pregnancy history and any concerns you have.  Insert an IV tube into one of your veins. This is used to give you fluids and medicines.  Check your blood pressure, pulse, temperature, and heart rate (vital signs).  Check whether your bag of water (amniotic sac) has broken (ruptured).  Talk with you about your birth plan and discuss pain control options.  Monitoring Your health care provider may monitor your contractions (uterine monitoring) and your baby's heart rate (fetal monitoring). You may need to be monitored:  Often, but not continuously (intermittently).  All the time or for long periods at a time (continuously). Continuous monitoring may be needed if: ? You are taking certain medicines, such as medicine to relieve pain or make your contractions stronger. ? You have pregnancy or labor complications.  Monitoring may be done by:  Placing a special stethoscope or a handheld monitoring device on your abdomen to   check your baby's heartbeat, and feeling your abdomen for contractions. This method of monitoring does not continuously record your baby's heartbeat or your contractions.  Placing monitors on your abdomen (external monitors) to record your baby's heartbeat and the frequency and length of contractions. You may not have to wear external monitors all the time.  Placing monitors inside of your uterus  (internal monitors) to record your baby's heartbeat and the frequency, length, and strength of your contractions. ? Your health care provider may use internal monitors if he or she needs more information about the strength of your contractions or your baby's heart rate. ? Internal monitors are put in place by passing a thin, flexible wire through your vagina and into your uterus. Depending on the type of monitor, it may remain in your uterus or on your baby's head until birth. ? Your health care provider will discuss the benefits and risks of internal monitoring with you and will ask for your permission before inserting the monitors.  Telemetry. This is a type of continuous monitoring that can be done with external or internal monitors. Instead of having to stay in bed, you are able to move around during telemetry. Ask your health care provider if telemetry is an option for you.  Physical exam Your health care provider may perform a physical exam. This may include:  Checking whether your baby is positioned: ? With the head toward your vagina (head-down). This is most common. ? With the head toward the top of your uterus (head-up or breech). If your baby is in a breech position, your health care provider may try to turn your baby to a head-down position so you can deliver vaginally. If it does not seem that your baby can be born vaginally, your provider may recommend surgery to deliver your baby. In rare cases, you may be able to deliver vaginally if your baby is head-up (breech delivery). ? Lying sideways (transverse). Babies that are lying sideways cannot be delivered vaginally.  Checking your cervix to determine: ? Whether it is thinning out (effacing). ? Whether it is opening up (dilating). ? How low your baby has moved into your birth canal.  What are the three stages of labor and delivery?  Normal labor and delivery is divided into the following three stages: Stage 1  Stage 1 is the  longest stage of labor, and it can last for hours or days. Stage 1 includes: ? Early labor. This is when contractions may be irregular, or regular and mild. Generally, early labor contractions are more than 10 minutes apart. ? Active labor. This is when contractions get longer, more regular, more frequent, and more intense. ? The transition phase. This is when contractions happen very close together, are very intense, and may last longer than during any other part of labor.  Contractions generally feel mild, infrequent, and irregular at first. They get stronger, more frequent (about every 2-3 minutes), and more regular as you progress from early labor through active labor and transition.  Many women progress through stage 1 naturally, but you may need help to continue making progress. If this happens, your health care provider may talk with you about: ? Rupturing your amniotic sac if it has not ruptured yet. ? Giving you medicine to help make your contractions stronger and more frequent.  Stage 1 ends when your cervix is completely dilated to 4 inches (10 cm) and completely effaced. This happens at the end of the transition phase. Stage 2  Once   your cervix is completely effaced and dilated to 4 inches (10 cm), you may start to feel an urge to push. It is common for the body to naturally take a rest before feeling the urge to push, especially if you received an epidural or certain other pain medicines. This rest period may last for up to 1-2 hours, depending on your unique labor experience.  During stage 2, contractions are generally less painful, because pushing helps relieve contraction pain. Instead of contraction pain, you may feel stretching and burning pain, especially when the widest part of your baby's head passes through the vaginal opening (crowning).  Your health care provider will closely monitor your pushing progress and your baby's progress through the vagina during stage 2.  Your  health care provider may massage the area of skin between your vaginal opening and anus (perineum) or apply warm compresses to your perineum. This helps it stretch as the baby's head starts to crown, which can help prevent perineal tearing. ? In some cases, an incision may be made in your perineum (episiotomy) to allow the baby to pass through the vaginal opening. An episiotomy helps to make the opening of the vagina larger to allow more room for the baby to fit through.  It is very important to breathe and focus so your health care provider can control the delivery of your baby's head. Your health care provider may have you decrease the intensity of your pushing, to help prevent perineal tearing.  After delivery of your baby's head, the shoulders and the rest of the body generally deliver very quickly and without difficulty.  Once your baby is delivered, the umbilical cord may be cut right away, or this may be delayed for 1-2 minutes, depending on your baby's health. This may vary among health care providers, hospitals, and birth centers.  If you and your baby are healthy enough, your baby may be placed on your chest or abdomen to help maintain the baby's temperature and to help you bond with each other. Some mothers and babies start breastfeeding at this time. Your health care team will dry your baby and help keep your baby warm during this time.  Your baby may need immediate care if he or she: ? Showed signs of distress during labor. ? Has a medical condition. ? Was born too early (prematurely). ? Had a bowel movement before birth (meconium). ? Shows signs of difficulty transitioning from being inside the uterus to being outside of the uterus. If you are planning to breastfeed, your health care team will help you begin a feeding. Stage 3  The third stage of labor starts immediately after the birth of your baby and ends after you deliver the placenta. The placenta is an organ that develops  during pregnancy to provide oxygen and nutrients to your baby in the womb.  Delivering the placenta may require some pushing, and you may have mild contractions. Breastfeeding can stimulate contractions to help you deliver the placenta.  After the placenta is delivered, your uterus should tighten (contract) and become firm. This helps to stop bleeding in your uterus. To help your uterus contract and to control bleeding, your health care provider may: ? Give you medicine by injection, through an IV tube, by mouth, or through your rectum (rectally). ? Massage your abdomen or perform a vaginal exam to remove any blood clots that are left in your uterus. ? Empty your bladder by placing a thin, flexible tube (catheter) into your bladder. ? Encourage   you to breastfeed your baby. After labor is over, you and your baby will be monitored closely to ensure that you are both healthy until you are ready to go home. Your health care team will teach you how to care for yourself and your baby. This information is not intended to replace advice given to you by your health care provider. Make sure you discuss any questions you have with your health care provider. Document Released: 05/15/2008 Document Revised: 02/24/2016 Document Reviewed: 08/21/2015 Elsevier Interactive Patient Education  2018 [employer]  

## 2017-03-25 NOTE — Progress Notes (Signed)
ROB-Pt doing well, reports irregular contractions nightly and increased pelvic pressure. 36 week cultures collected. Reviewed red flag symptoms and when to call. RTC x 1 week for ROB or sooner if needed.

## 2017-03-25 NOTE — Progress Notes (Signed)
ROB- cultures obtained, pt is having a lot pelvic pressure, having some contractions

## 2017-03-26 LAB — GC/CHLAMYDIA PROBE AMP
Chlamydia trachomatis, NAA: NEGATIVE
Neisseria gonorrhoeae by PCR: NEGATIVE

## 2017-03-27 LAB — STREP GP B NAA: STREP GROUP B AG: NEGATIVE

## 2017-04-03 ENCOUNTER — Ambulatory Visit (INDEPENDENT_AMBULATORY_CARE_PROVIDER_SITE_OTHER): Payer: 59 | Admitting: Obstetrics and Gynecology

## 2017-04-03 VITALS — BP 119/72 | HR 71 | Wt 177.2 lb

## 2017-04-03 DIAGNOSIS — Z3493 Encounter for supervision of normal pregnancy, unspecified, third trimester: Secondary | ICD-10-CM

## 2017-04-03 LAB — POCT URINALYSIS DIPSTICK
BILIRUBIN UA: NEGATIVE
Blood, UA: NEGATIVE
GLUCOSE UA: NEGATIVE
KETONES UA: NEGATIVE
LEUKOCYTES UA: NEGATIVE
Nitrite, UA: NEGATIVE
Protein, UA: NEGATIVE
Spec Grav, UA: 1.01 (ref 1.010–1.025)
Urobilinogen, UA: 0.2 E.U./dL
pH, UA: 6.5 (ref 5.0–8.0)

## 2017-04-03 NOTE — Progress Notes (Signed)
ROB- pt is having a lot of pelvic pressure, some contractions 

## 2017-04-03 NOTE — Progress Notes (Signed)
ROB-labor precautions, reviewed negative cultures.

## 2017-04-06 ENCOUNTER — Observation Stay
Admission: EM | Admit: 2017-04-06 | Discharge: 2017-04-06 | Disposition: A | Discharge status: Home / Self Care | Payer: 59 | Source: Home / Self Care | Admitting: Obstetrics and Gynecology

## 2017-04-06 DIAGNOSIS — O26853 Spotting complicating pregnancy, third trimester: Secondary | ICD-10-CM | POA: Diagnosis not present

## 2017-04-06 DIAGNOSIS — Z3490 Encounter for supervision of normal pregnancy, unspecified, unspecified trimester: Secondary | ICD-10-CM

## 2017-04-06 DIAGNOSIS — Z3A38 38 weeks gestation of pregnancy: Secondary | ICD-10-CM | POA: Diagnosis not present

## 2017-04-09 ENCOUNTER — Encounter: Payer: 59 | Admitting: Certified Nurse Midwife

## 2017-04-09 ENCOUNTER — Inpatient Hospital Stay
Admission: EM | Admit: 2017-04-09 | Discharge: 2017-04-10 | DRG: 775 | Disposition: A | Discharge status: Home / Self Care | Payer: 59 | Attending: Certified Nurse Midwife | Admitting: Certified Nurse Midwife

## 2017-04-09 DIAGNOSIS — Z3A38 38 weeks gestation of pregnancy: Secondary | ICD-10-CM | POA: Diagnosis not present

## 2017-04-09 DIAGNOSIS — O9081 Anemia of the puerperium: Secondary | ICD-10-CM | POA: Diagnosis not present

## 2017-04-09 DIAGNOSIS — Z3493 Encounter for supervision of normal pregnancy, unspecified, third trimester: Secondary | ICD-10-CM | POA: Diagnosis present

## 2017-04-09 LAB — CBC
HEMATOCRIT: 36.2 % (ref 35.0–47.0)
Hemoglobin: 12.2 g/dL (ref 12.0–16.0)
MCH: 29.7 pg (ref 26.0–34.0)
MCHC: 33.8 g/dL (ref 32.0–36.0)
MCV: 87.8 fL (ref 80.0–100.0)
Platelets: 291 10*3/uL (ref 150–440)
RBC: 4.12 MIL/uL (ref 3.80–5.20)
RDW: 13.5 % (ref 11.5–14.5)
WBC: 14.4 10*3/uL — ABNORMAL HIGH (ref 3.6–11.0)

## 2017-04-09 MED ORDER — COCONUT OIL OIL: 1.0000 "application " | TOPICAL_OIL | Status: DC | PRN

## 2017-04-09 MED ORDER — LIDOCAINE HCL (PF) 1 % IJ SOLN: 30.0000 mL | INTRAMUSCULAR | Status: DC | PRN

## 2017-04-09 MED ORDER — ACETAMINOPHEN 325 MG PO TABS: 650.0000 mg | ORAL_TABLET | ORAL | Status: DC | PRN

## 2017-04-09 MED ORDER — BENZOCAINE-MENTHOL 20-0.5 % EX AERO: 1.0000 "application " | INHALATION_SPRAY | CUTANEOUS | Status: DC | PRN

## 2017-04-09 MED ORDER — LACTATED RINGERS IV SOLN: 500.0000 mL | INTRAVENOUS | Status: DC | PRN

## 2017-04-09 MED ORDER — MISOPROSTOL 200 MCG PO TABS
ORAL_TABLET | ORAL | Status: AC
  Filled 2017-04-09: qty 4

## 2017-04-09 MED ORDER — LACTATED RINGERS IV SOLN: INTRAVENOUS | Status: DC

## 2017-04-09 MED ORDER — SIMETHICONE 80 MG PO CHEW: 80.0000 mg | CHEWABLE_TABLET | ORAL | Status: DC | PRN

## 2017-04-09 MED ORDER — OXYTOCIN 40 UNITS IN LACTATED RINGERS INFUSION - SIMPLE MED
INTRAVENOUS | Status: AC
  Administered 2017-04-09: 500 mL via INTRAVENOUS
  Filled 2017-04-09: qty 1000

## 2017-04-09 MED ORDER — WITCH HAZEL-GLYCERIN EX PADS: 1.0000 "application " | MEDICATED_PAD | CUTANEOUS | Status: DC | PRN

## 2017-04-09 MED ORDER — ONDANSETRON HCL 4 MG PO TABS: 4.0000 mg | ORAL_TABLET | ORAL | Status: DC | PRN

## 2017-04-09 MED ORDER — OXYCODONE-ACETAMINOPHEN 5-325 MG PO TABS: 2.0000 | ORAL_TABLET | ORAL | Status: DC | PRN

## 2017-04-09 MED ORDER — ACETAMINOPHEN 325 MG PO TABS
650.0000 mg | ORAL_TABLET | ORAL | Status: DC | PRN
  Administered 2017-04-10: 650 mg via ORAL
  Filled 2017-04-09: qty 2

## 2017-04-09 MED ORDER — OXYTOCIN 10 UNIT/ML IJ SOLN
INTRAMUSCULAR | Status: AC
  Filled 2017-04-09: qty 2

## 2017-04-09 MED ORDER — BUTORPHANOL TARTRATE 2 MG/ML IJ SOLN: 1.0000 mg | INTRAMUSCULAR | Status: DC | PRN

## 2017-04-09 MED ORDER — MISOPROSTOL 200 MCG PO TABS: 800.0000 ug | ORAL_TABLET | Freq: Once | ORAL | Status: DC

## 2017-04-09 MED ORDER — ZOLPIDEM TARTRATE 5 MG PO TABS: 5.0000 mg | ORAL_TABLET | Freq: Every evening | ORAL | Status: DC | PRN

## 2017-04-09 MED ORDER — IBUPROFEN 800 MG PO TABS
800.0000 mg | ORAL_TABLET | Freq: Three times a day (TID) | ORAL | Status: DC
  Administered 2017-04-09 – 2017-04-10 (×5): 800 mg via ORAL
  Filled 2017-04-09 (×4): qty 1

## 2017-04-09 MED ORDER — PRENATAL MULTIVITAMIN CH
1.0000 | ORAL_TABLET | Freq: Every day | ORAL | Status: DC
  Administered 2017-04-09 – 2017-04-10 (×2): 1 via ORAL
  Filled 2017-04-09 (×2): qty 1

## 2017-04-09 MED ORDER — DIBUCAINE 1 % RE OINT: 1.0000 "application " | TOPICAL_OINTMENT | RECTAL | Status: DC | PRN

## 2017-04-09 MED ORDER — IBUPROFEN 800 MG PO TABS
ORAL_TABLET | ORAL | Status: AC
  Filled 2017-04-09: qty 1

## 2017-04-09 MED ORDER — OXYCODONE-ACETAMINOPHEN 5-325 MG PO TABS: 1.0000 | ORAL_TABLET | ORAL | Status: DC | PRN

## 2017-04-09 MED ORDER — OXYTOCIN BOLUS FROM INFUSION
500.0000 mL | Freq: Once | INTRAVENOUS | Status: DC
  Administered 2017-04-09: 500 mL via INTRAVENOUS

## 2017-04-09 MED ORDER — SOD CITRATE-CITRIC ACID 500-334 MG/5ML PO SOLN: 30.0000 mL | ORAL | Status: DC | PRN

## 2017-04-09 MED ORDER — ONDANSETRON HCL 4 MG/2ML IJ SOLN: 4.0000 mg | Freq: Four times a day (QID) | INTRAMUSCULAR | Status: DC | PRN

## 2017-04-09 MED ORDER — OXYTOCIN 40 UNITS IN LACTATED RINGERS INFUSION - SIMPLE MED
2.5000 [IU]/h | INTRAVENOUS | Status: DC
  Administered 2017-04-09: 2.5 [IU]/h via INTRAVENOUS
  Filled 2017-04-09: qty 1000

## 2017-04-09 MED ORDER — FLEET ENEMA 7-19 GM/118ML RE ENEM: 1.0000 | ENEMA | Freq: Every day | RECTAL | Status: DC | PRN

## 2017-04-09 MED ORDER — SENNOSIDES-DOCUSATE SODIUM 8.6-50 MG PO TABS
2.0000 | ORAL_TABLET | ORAL | Status: DC
  Filled 2017-04-09: qty 2

## 2017-04-09 MED ORDER — ONDANSETRON HCL 4 MG/2ML IJ SOLN: 4.0000 mg | INTRAMUSCULAR | Status: DC | PRN

## 2017-04-09 MED ORDER — AMMONIA AROMATIC IN INHA
RESPIRATORY_TRACT | Status: AC
Start: 2017-04-09 — End: 2017-04-09
  Filled 2017-04-09: qty 10

## 2017-04-09 MED ORDER — BENZOCAINE-MENTHOL 20-0.5 % EX AERO
INHALATION_SPRAY | CUTANEOUS | Status: AC
  Filled 2017-04-09: qty 56

## 2017-04-09 MED ORDER — LIDOCAINE HCL (PF) 1 % IJ SOLN
INTRAMUSCULAR | Status: AC
  Filled 2017-04-09: qty 30

## 2017-04-09 MED ORDER — OXYCODONE-ACETAMINOPHEN 5-325 MG PO TABS
1.0000 | ORAL_TABLET | ORAL | Status: DC | PRN
  Administered 2017-04-09 (×4): 1 via ORAL
  Filled 2017-04-09 (×4): qty 1

## 2017-04-09 MED ORDER — DIPHENHYDRAMINE HCL 25 MG PO CAPS: 25.0000 mg | ORAL_CAPSULE | Freq: Four times a day (QID) | ORAL | Status: DC | PRN

## 2017-04-09 MED ORDER — TETANUS-DIPHTH-ACELL PERTUSSIS 5-2.5-18.5 LF-MCG/0.5 IM SUSP: 0.5000 mL | Freq: Once | INTRAMUSCULAR | Status: DC

## 2017-04-09 NOTE — Discharge Summary (Signed)
L&D OB Triage Note  [name] is a 26 y.o. [id number] female at [redacted]w[redacted]d, EDD Estimated Date of Delivery: 04/20/17 who presented to triage for complaints of irregular contractions and spotting.  She was evaluated by the nurses with no significant findings/findings significant for active labor. Vital signs stable. An NST was performed and has been reviewed by me. She was reassured it was not active labor.   NST INTERPRETATION: Indications: rule out uterine contractions  Mode: External Baseline Rate (A): 120 bpm Variability: Moderate Accelerations: 15 x 15 Decelerations: None     Contraction Frequency (min): 2-7  Impression: reactive   Plan: NST performed was reviewed and was found to be reactive. She was discharged home with bleeding/labor precautions.  Continue routine prenatal care. Follow up with OB/GYN as previously scheduled.     Melody Suzan Nailer, CNM

## 2017-04-09 NOTE — H&P (Signed)
Obstetric History and Physical  Sara Webb is a 26 y.o. [phone] with IUP at [redacted]w[redacted]d, dated by 8 week ultrasound, presenting with regular uterine contractions since midnight.   Denies vaginal bleeding and leakage of fluid. Endorses good fetal movement.   Denies difficulty breathing or respiratory distress, chest pain, and leg pain or swelling.   Prenatal Course  Source of Care: [facility]   Pregnancy complications or risks: none  Prenatal labs and studies:  ABO, Rh: O/Positive/-- Oct 11, 2022 1048)  Antibody: Negative 2022/10/11 1048)  Rubella: 1.56 10-11-22 1048)   Varicella: 2032 2022/10/11 1048)  RPR: Non Reactive 2022-10-11 1048)   HBsAg: Negative 2022/10/11 1048)   HIV: Non Reactive 11-Oct-2022 1048)  NPY:YFRTMYTR (08/06 1518)  1 hr Glucola: 80 (06/12 1039)  Genetic screening: normal (02/02 1646)  Anatomy US normal  Past Medical History:  Diagnosis Date  . migraine     Past Surgical History:  Procedure Laterality Date  . LAPAROSCOPIC OVARIAN CYSTECTOMY Right 05/18/2016   Procedure: LAPAROSCOPIC OVARIAN CYSTECTOMY;  Surgeon: Nadara Mustard, MD;  Location: [facility] ORS;  Service: Gynecology;  Laterality: Right;  . NO PAST SURGERIES      OB History  Gravida Para Term Preterm AB Living  4 3 3   1 3   SAB TAB Ectopic Multiple Live Births      1 0 3    # Outcome Date GA Lbr Len/2nd Weight Sex Delivery Anes PTL Lv  4 Current         LIV  3 Ectopic 2015        ND  2 Term     M Vag-Spont   LIV  1 Term     M Vag-Spont   LIV      Social History   Social History  . Marital status: Married    Spouse name: N/A  . Number of children: N/A  . Years of education: N/A   Social History Main Topics  . Smoking status: Never Smoker  . Smokeless tobacco: Never Used  . Alcohol use No  . Drug use: No  . Sexual activity: Yes    Birth control/ protection: None   Other Topics Concern  . Not on file   Social History Narrative  . No narrative on file    Family History  Problem Relation Age  of Onset  . Cancer Paternal Aunt        melanoma    Prescriptions Prior to Admission  Medication Sig Dispense Refill Last Dose  . acetaminophen (TYLENOL) 325 MG tablet Take by mouth.   Taking  . loratadine (CLARITIN) 10 MG tablet Take 10 mg by mouth daily.   Taking  . Prenatal Vit-Fe Fumarate-FA (MULTIVITAMIN-PRENATAL) 27-0.8 MG TABS tablet Take 1 tablet by mouth daily at 12 noon.   Taking    Allergies  Allergen Reactions  . Sulfa Antibiotics     Childhood allergy    Review of Systems: Negative except for what is mentioned in HPI.  Physical Exam:  BP 123/79   Pulse (!) 128   Temp 97.7 F (36.5 C) (Oral)   Resp (!) 22   Ht 5\' 7"  (1.702 m)   Wt 175 lb (79.4 kg)   LMP 07/14/2016 (Approximate)   SpO2 99%   Breastfeeding? Unknown   BMI 27.41 kg/m   GENERAL: Well-developed, well-nourished female in no acute distress.   LUNGS: Clear to auscultation bilaterally.   HEART: Regular rate and rhythm.  ABDOMEN: Soft, nontender, nondistended, gravid.  EXTREMITIES: Nontender,  no edema, 2+ distal pulses.  Cervical Exam: Dilation: 10 Dilation Complete Date: 04/09/17 Dilation Complete Time: 0128 Effacement (%): 100 Exam by:: sca  FHT:  Baseline rate 120 bpm   Variability moderate  Accelerations present   Decelerations early  Contractions: Every two (2) to three (3) minutes   Pertinent Labs/Studies:   Results for orders placed or performed during the hospital encounter of 04/09/17 (from the past 24 hour(s))  CBC     Status: Abnormal   Collection Time: 04/09/17  1:35 AM  Result Value Ref Range   WBC 14.4 (H) 3.6 - 11.0 K/uL   RBC 4.12 3.80 - 5.20 MIL/uL   Hemoglobin 12.2 12.0 - 16.0 g/dL   HCT 16.1 09.6 - 04.5 %   MCV 87.8 80.0 - 100.0 fL   MCH 29.7 26.0 - 34.0 pg   MCHC 33.8 32.0 - 36.0 g/dL   RDW 40.9 81.1 - 91.4 %   Platelets 291 150 - 440 K/uL    Assessment : Sara Webb is a 26 y.o. [phone] at [redacted]w[redacted]d being admitted for labor, O positive, GBS negative  FHR  Category I  Plan:  Admit to birthing suites.   Delivery plan: Hopeful for vaginal delivery.    Gunnar Bulla, CNM [facility], [facility]

## 2017-04-09 NOTE — Progress Notes (Signed)
Post Partum Day 1/2  Subjective:  Pt sitting quietly in bed with family members at bedside. Eating, voiding, and ambulating without assistance. Reports uterine cramping.  Denies difficulty breathing or respiratory distress, chest pain, excessive vaginal bleeding, and leg pain or swelling.   Objective:  Temp:  [97.7 F (36.5 C)-98.3 F (36.8 C)] 98.2 F (36.8 C) (08/21 1155) Pulse Rate:  [69-128] 75 (08/21 1155) Resp:  [17-22] 17 (08/21 1155) BP: (87-127)/(52-84) 109/64 (08/21 1155) SpO2:  [98 %-100 %] 99 % (08/21 0809) Weight:  [175 lb (79.4 kg)] 175 lb (79.4 kg) (08/21 0241)  Physical Exam:   General: alert and cooperative   Heart: RRR  Lungs: CTAB  Breast: deferred, no complaints  Lochia: appropriate  Uterine Fundus: firm  Perineum: intact, no redness or swelling  DVT Evaluation: No evidence of DVT seen on physical exam. Negative Homan's sign.   Recent Labs  04/09/17 0135  HGB 12.2  HCT 36.2    Assessment:  Status post vaginal birth  Rh positive  Formula feeding  Plan  CBC in AM.  Plan discharge tomorrow.  Desires POP as postpartum contraception.  Reviewed red flag symptoms and when to call.  Reassess as needed.    LOS: 0 days     Gunnar Bulla, CNM 04/09/2017, 1:23 PM

## 2017-04-10 DIAGNOSIS — O9081 Anemia of the puerperium: Secondary | ICD-10-CM | POA: Diagnosis not present

## 2017-04-10 LAB — CBC
HCT: 28.2 % — ABNORMAL LOW (ref 35.0–47.0)
Hemoglobin: 9.3 g/dL — ABNORMAL LOW (ref 12.0–16.0)
MCH: 29.3 pg (ref 26.0–34.0)
MCHC: 33.1 g/dL (ref 32.0–36.0)
MCV: 88.4 fL (ref 80.0–100.0)
PLATELETS: 229 10*3/uL (ref 150–440)
RBC: 3.19 MIL/uL — AB (ref 3.80–5.20)
RDW: 13.3 % (ref 11.5–14.5)
WBC: 9.6 10*3/uL (ref 3.6–11.0)

## 2017-04-10 LAB — RPR: RPR Ser Ql: NONREACTIVE

## 2017-04-10 MED ORDER — NORETHINDRONE 0.35 MG PO TABS: 1.0000 | ORAL_TABLET | Freq: Every day | ORAL | 11 refills | Status: DC

## 2017-04-10 MED ORDER — FERROUS SULFATE 325 (65 FE) MG PO TABS: 325.0000 mg | ORAL_TABLET | Freq: Two times a day (BID) | ORAL | Status: DC

## 2017-04-10 MED ORDER — IBUPROFEN 800 MG PO TABS: 800.0000 mg | ORAL_TABLET | Freq: Three times a day (TID) | ORAL | 0 refills | Status: DC

## 2017-04-10 MED ORDER — FERROUS SULFATE 325 (65 FE) MG PO TABS: 325.0000 mg | ORAL_TABLET | Freq: Two times a day (BID) | ORAL | 3 refills | Status: DC

## 2017-04-10 NOTE — Progress Notes (Signed)
Both parents viewed the DVD, "The Period of Purple Cry" prior to discharge home and given a copy as well.

## 2017-04-10 NOTE — Discharge Summary (Signed)
Obstetric Discharge Summary  Patient ID: Sara Webb MRN: [id number] DOB/AGE: 01-02-1991 26 y.o.   Date of Admission: 04/09/2017 Serafina Royals, CNM Horton Marshall, MD)  Date of Discharge: Serafina Royals, CNM Horton Marshall, MD)  Admitting Diagnosis: Onset of Labor at [redacted]w[redacted]d  Secondary Diagnosis: None  Mode of Delivery: normal spontaneous vaginal delivery     Discharge Diagnosis: Postpartum anemia   Intrapartum Procedures: Atificial rupture of membranes   Post partum procedures: None  Complications: none   Brief Hospital Course  Sara Webb is a [id number] who had a SVD on 08/21/20187;  for further details of this surgery, please refer to the delivey note.  Patient had an uncomplicated postpartum course.  By time of discharge on PPD#1, her pain was controlled on oral pain medications; she had appropriate lochia and was ambulating, voiding without difficulty and tolerating regular diet.  She was deemed stable for discharge to home.    Labs: CBC Latest Ref Rng & Units 04/10/2017 04/09/2017 01/29/2017  WBC 3.6 - 11.0 K/uL 9.6 14.4(H) -  Hemoglobin 12.0 - 16.0 g/dL 1.2(A) 44.9 75.3  Hematocrit 35.0 - 47.0 % 28.2(L) 36.2 35.4  Platelets 150 - 440 K/uL 229 291 -   O  Physical exam:   Temp:  [97.8 F (36.6 C)-98.5 F (36.9 C)] 97.9 F (36.6 C) (08/22 0838) Pulse Rate:  [62-75] 63 (08/22 0838) Resp:  [17-18] 18 (08/22 0838) BP: (99-118)/(56-82) 99/66 (08/22 0838) SpO2:  [98 %-100 %] 100 % (08/22 0051)  General: alert and no distress  Lochia: appropriate  Abdomen: soft, NT  Uterine Fundus: firm  Perineum: Intact  Extremities: No evidence of DVT seen on physical exam. No lower extremity edema.  Discharge Instructions: Per After Visit Summary.  Activity: Advance as tolerated. Pelvic rest for 6 weeks.  Also refer to After Visit Summary  Diet: Regular  Medications: Allergies as of 04/10/2017      Reactions   Sulfa Antibiotics    Childhood allergy      Medication  List    STOP taking these medications   acetaminophen 325 MG tablet Commonly known as:  TYLENOL     TAKE these medications   ferrous sulfate 325 (65 FE) MG tablet Take 1 tablet (325 mg total) by mouth 2 (two) times daily with a meal.   ibuprofen 800 MG tablet Commonly known as:  ADVIL,MOTRIN Take 1 tablet (800 mg total) by mouth every 8 (eight) hours.   loratadine 10 MG tablet Commonly known as:  CLARITIN Take 10 mg by mouth daily.   multivitamin-prenatal 27-0.8 MG Tabs tablet Take 1 tablet by mouth daily at 12 noon.   norethindrone 0.35 MG tablet Commonly known as:  MICRONOR,CAMILA,ERRIN Take 1 tablet (0.35 mg total) by mouth daily.            Discharge Care Instructions        Start     Ordered   04/10/17 0000  ibuprofen (ADVIL,MOTRIN) 800 MG tablet  Every 8 hours    Question:  Supervising Provider  Answer:  Hildred Laser   04/10/17 0955   04/10/17 0000  norethindrone (MICRONOR,CAMILA,ERRIN) 0.35 MG tablet  Daily    Question:  Supervising Provider  Answer:  Hildred Laser   04/10/17 0955   04/10/17 0000  ferrous sulfate 325 (65 FE) MG tablet  2 times daily with meals    Question:  Supervising Provider  Answer:  Hildred Laser   04/10/17 1001     Outpatient follow up:  Follow-up  Information    Dajsha Massaro, [place], CNM. Call.   Specialties:  Certified Nurse Midwife, Obstetrics and Gynecology, Radiology Why:  Please call to schedule your six (6) week postpartum visit Contact information: [address] [place] [address] [phone]          Postpartum contraception: oral progesterone-only contraceptive  Discharged Condition: stable  Discharged to: home   Newborn Data:  Disposition:home with mother  Apgars: APGAR (1 MIN): 8   APGAR (5 MINS): 9   APGAR (10 MINS):    Baby Feeding: Bottle   Gunnar Bulla, CNM

## 2017-04-10 NOTE — Discharge Instructions (Signed)
Postpartum Care After Vaginal Delivery °The period of time right after you deliver your newborn is called the postpartum period. °What kind of medical care will I receive? °· You may continue to receive fluids and medicines through an IV tube inserted into one of your veins. °· If an incision was made near your vagina (episiotomy) or if you had some vaginal tearing during delivery, cold compresses may be placed on your episiotomy or your tear. This helps to reduce pain and swelling. °· You may be given a squirt bottle to use when you go to the bathroom. You may use this until you are comfortable wiping as usual. To use the squirt bottle, follow these steps: °? Before you urinate, fill the squirt bottle with warm water. Do not use hot water. °? After you urinate, while you are sitting on the toilet, use the squirt bottle to rinse the area around your urethra and vaginal opening. This rinses away any urine and blood. °? You may do this instead of wiping. As you start healing, you may use the squirt bottle before wiping yourself. Make sure to wipe gently. °? Fill the squirt bottle with clean water every time you use the bathroom. °· You will be given sanitary pads to wear. °How can I expect to feel? °· You may not feel the need to urinate for several hours after delivery. °· You will have some soreness and pain in your abdomen and vagina. °· If you are breastfeeding, you may have uterine contractions every time you breastfeed for up to several weeks postpartum. Uterine contractions help your uterus return to its normal size. °· It is normal to have vaginal bleeding (lochia) after delivery. The amount and appearance of lochia is often similar to a menstrual period in the first week after delivery. It will gradually decrease over the next few weeks to a dry, yellow-brown discharge. For most women, lochia stops completely by 6-8 weeks after delivery. Vaginal bleeding can vary from woman to woman. °· Within the first few  days after delivery, you may have breast engorgement. This is when your breasts feel heavy, full, and uncomfortable. Your breasts may also throb and feel hard, tightly stretched, warm, and tender. After this occurs, you may have milk leaking from your breasts. Your health care provider can help you relieve discomfort due to breast engorgement. Breast engorgement should go away within a few days. °· You may feel more sad or worried than normal due to hormonal changes after delivery. These feelings should not last more than a few days. If these feelings do not go away after several days, speak with your health care provider. °How should I care for myself? °· Tell your health care provider if you have pain or discomfort. °· Drink enough water to keep your urine clear or pale yellow. °· Wash your hands thoroughly with soap and water for at least 20 seconds after changing your sanitary pads, after using the toilet, and before holding or feeding your baby. °· If you are not breastfeeding, avoid touching your breasts a lot. Doing this can make your breasts produce more milk. °· If you become weak or lightheaded, or you feel like you might faint, ask for help before: °? Getting out of bed. °? Showering. °· Change your sanitary pads frequently. Watch for any changes in your flow, such as a sudden increase in volume, a change in color, the passing of large blood clots. If you pass a blood clot from your vagina, save it   to show to your health care provider. Do not flush blood clots down the toilet without having your health care provider look at them. °· Make sure that all your vaccinations are up to date. This can help protect you and your baby from getting certain diseases. You may need to have immunizations done before you leave the hospital. °· If desired, talk with your health care provider about methods of family planning or birth control (contraception). °How can I start bonding with my baby? °Spending as much time as  possible with your baby is very important. During this time, you and your baby can get to know each other and develop a bond. Having your baby stay with you in your room (rooming in) can give you time to get to know your baby. Rooming in can also help you become comfortable caring for your baby. Breastfeeding can also help you bond with your baby. °How can I plan for returning home with my baby? °· Make sure that you have a car seat installed in your vehicle. °? Your car seat should be checked by a certified car seat installer to make sure that it is installed safely. °? Make sure that your baby fits into the car seat safely. °· Ask your health care provider any questions you have about caring for yourself or your baby. Make sure that you are able to contact your health care provider with any questions after leaving the hospital. °This information is not intended to replace advice given to you by your health care provider. Make sure you discuss any questions you have with your health care provider. °Document Released: 06/03/2007 Document Revised: 01/09/2016 Document Reviewed: 07/11/2015 °Elsevier Interactive Patient Education © 2018 [employer] ° °

## 2017-04-17 ENCOUNTER — Encounter: Payer: Self-pay | Admitting: Certified Nurse Midwife

## 2017-04-18 ENCOUNTER — Other Ambulatory Visit: Payer: Self-pay | Admitting: *Deleted

## 2017-04-18 ENCOUNTER — Telehealth: Payer: Self-pay | Admitting: Certified Nurse Midwife

## 2017-04-18 MED ORDER — CEFDINIR 300 MG PO CAPS: 300.0000 mg | ORAL_CAPSULE | Freq: Two times a day (BID) | ORAL | 0 refills | Status: DC

## 2017-04-18 NOTE — Telephone Encounter (Signed)
Omnicef 300 mg BID x 7 days, dispense 14, no refills. Thanks, JML

## 2017-04-18 NOTE — Telephone Encounter (Signed)
Pt stated that she thinks she has a sinus infection and they have a one week old. [facility]. Pt would rather not come in the office unless she has to. Please advise. Thanks TNP

## 2017-05-17 ENCOUNTER — Ambulatory Visit (INDEPENDENT_AMBULATORY_CARE_PROVIDER_SITE_OTHER): Payer: 59 | Admitting: Certified Nurse Midwife

## 2017-05-17 DIAGNOSIS — Z23 Encounter for immunization: Secondary | ICD-10-CM

## 2017-05-17 DIAGNOSIS — O99345 Other mental disorders complicating the puerperium: Secondary | ICD-10-CM

## 2017-05-17 DIAGNOSIS — F53 Postpartum depression: Secondary | ICD-10-CM

## 2017-05-17 DIAGNOSIS — O9081 Anemia of the puerperium: Secondary | ICD-10-CM

## 2017-05-17 MED ORDER — SERTRALINE HCL 50 MG PO TABS: 50.0000 mg | ORAL_TABLET | Freq: Every day | ORAL | 1 refills | Status: DC

## 2017-05-17 NOTE — Patient Instructions (Signed)

## 2017-05-17 NOTE — Progress Notes (Signed)
Subjective:    Sara Webb is a 26 y.o. [phone] Caucasian female who presents for a postpartum visit.   She is 6 weeks postpartum following a spontaneous vaginal delivery at 38+3 gestational weeks. Anesthesia: none. I have fully reviewed the prenatal and intrapartum course.   Postpartum course has been uncomplicated. Baby's course has been uncomplicated. Baby is feeding by bottle - Similac Advance. Bleeding no bleeding. Bowel function is normal. Bladder function is normal.   Patient is not sexually active. Contraception method is oral progesterone-only contraceptive. Postpartum depression screening: positive. Score 19.  Last pap 2017 and was normal.  The following portions of the patient's history were reviewed and updated as appropriate: allergies, current medications, past medical history, past surgical history and problem list.  Review of Systems  Pertinent items are noted in HPI.   Objective:   BP 103/73   Pulse (!) 54   Wt 151 lb 9 oz (68.7 kg)   Breastfeeding? No   BMI 23.74 kg/m   General:  alert, cooperative and no distress   Breasts:  deferred, no complaints  Lungs: clear to auscultation bilaterally  Heart:  regular rate and rhythm  Abdomen: soft, nontender   Vulva: normal  Vagina: normal vagina  Cervix:  closed  Corpus: Well-involuted  Adnexa:  Non-palpable          Edinburgh Postnatal Depression Scale - 05/17/17 0933      Edinburgh Postnatal Depression Scale:  In the Past 7 Days   I have been able to laugh and see the funny side of things. 1   I have looked forward with enjoyment to things. 2   I have blamed myself unnecessarily when things went wrong. 3   I have been anxious or worried for no good reason. 2   I have felt scared or panicky for no good reason. 2   Things have been getting on top of me. 2   I have been so unhappy that I have had difficulty sleeping. 2   I have felt sad or miserable. 2   I have been so unhappy that I have been crying. 2   The thought of harming myself has occurred to me. 1   Edinburgh Postnatal Depression Scale Total 19      Assessment:   Postpartum exam Postpartum anemia Six (6) wks s/p spontaneous vaginal birth Formula feeding Depression screening positive Contraception counseling  Flu vaccination  Plan:   Rx: Zoloft, see orders.   Referral to Psychiatry. Information also given for [employer], LCSW.   Reviewed red flag symptoms and when to call.   Follow up in: 5 weeks for medication check or earlier if needed.   Gunnar Bulla, CNM

## 2017-05-17 NOTE — Progress Notes (Signed)
Pt is here for a 6 week post partum visit. Bottle feeding,started OCPs at 4 weeks and has had some spotting. Has not resumed intercourse.

## 2017-05-18 LAB — CBC
HEMOGLOBIN: 13 g/dL (ref 11.1–15.9)
Hematocrit: 40 % (ref 34.0–46.6)
MCH: 29.3 pg (ref 26.6–33.0)
MCHC: 32.5 g/dL (ref 31.5–35.7)
MCV: 90 fL (ref 79–97)
Platelets: 310 10*3/uL (ref 150–379)
RBC: 4.43 x10E6/uL (ref 3.77–5.28)
RDW: 15.1 % (ref 12.3–15.4)
WBC: 6.1 10*3/uL (ref 3.4–10.8)

## 2017-05-21 ENCOUNTER — Encounter: Payer: 59 | Admitting: Certified Nurse Midwife

## 2017-05-28 ENCOUNTER — Encounter: Payer: Self-pay | Admitting: Certified Nurse Midwife

## 2017-06-14 ENCOUNTER — Ambulatory Visit (INDEPENDENT_AMBULATORY_CARE_PROVIDER_SITE_OTHER): Payer: 59 | Admitting: Certified Nurse Midwife

## 2017-06-14 ENCOUNTER — Encounter: Payer: Self-pay | Admitting: Certified Nurse Midwife

## 2017-06-14 VITALS — BP 100/72 | HR 80

## 2017-06-14 DIAGNOSIS — Z79899 Other long term (current) drug therapy: Secondary | ICD-10-CM

## 2017-06-14 NOTE — Patient Instructions (Addendum)
Sertraline tablets What is this medicine? SERTRALINE (SER tra leen) is used to treat depression. It may also be used to treat obsessive compulsive disorder, panic disorder, post-trauma stress, premenstrual dysphoric disorder (PMDD) or social anxiety. This medicine may be used for other purposes; ask your health care provider or pharmacist if you have questions. COMMON BRAND NAME(S): Zoloft What should I tell my health care provider before I take this medicine? They need to know if you have any of these conditions: -bleeding disorders -bipolar disorder or a family history of bipolar disorder -glaucoma -heart disease -high blood pressure -history of irregular heartbeat -history of low levels of calcium, magnesium, or potassium in the blood -if you often drink alcohol -liver disease -receiving electroconvulsive therapy -seizures -suicidal thoughts, plans, or attempt; a previous suicide attempt by you or a family member -take medicines that treat or prevent blood clots -thyroid disease -an unusual or allergic reaction to sertraline, other medicines, foods, dyes, or preservatives -pregnant or trying to get pregnant -breast-feeding How should I use this medicine? Take this medicine by mouth with a glass of water. Follow the directions on the prescription label. You can take it with or without food. Take your medicine at regular intervals. Do not take your medicine more often than directed. Do not stop taking this medicine suddenly except upon the advice of your doctor. Stopping this medicine too quickly may cause serious side effects or your condition may worsen. A special MedGuide will be given to you by the pharmacist with each prescription and refill. Be sure to read this information carefully each time. Talk to your pediatrician regarding the use of this medicine in children. While this drug may be prescribed for children as young as 7 years for selected conditions, precautions do  apply. Overdosage: If you think you have taken too much of this medicine contact a poison control center or emergency room at once. NOTE: This medicine is only for you. Do not share this medicine with others. What if I miss a dose? If you miss a dose, take it as soon as you can. If it is almost time for your next dose, take only that dose. Do not take double or extra doses. What may interact with this medicine? Do not take this medicine with any of the following medications: -cisapride -dofetilide -dronedarone -linezolid -MAOIs like Carbex, Eldepryl, Marplan, Nardil, and Parnate -methylene blue (injected into a vein) -pimozide -thioridazine This medicine may also interact with the following medications: -alcohol -amphetamines -aspirin and aspirin-like medicines -certain medicines for depression, anxiety, or psychotic disturbances -certain medicines for fungal infections like ketoconazole, fluconazole, posaconazole, and itraconazole -certain medicines for irregular heart beat like flecainide, quinidine, propafenone -certain medicines for migraine headaches like almotriptan, eletriptan, frovatriptan, naratriptan, rizatriptan, sumatriptan, zolmitriptan -certain medicines for sleep -certain medicines for seizures like carbamazepine, valproic acid, phenytoin -certain medicines that treat or prevent blood clots like warfarin, enoxaparin, dalteparin -cimetidine -digoxin -diuretics -fentanyl -isoniazid -lithium -NSAIDs, medicines for pain and inflammation, like ibuprofen or naproxen -other medicines that prolong the QT interval (cause an abnormal heart rhythm) -rasagiline -safinamide -supplements like St. John's wort, kava kava, valerian -tolbutamide -tramadol -tryptophan This list may not describe all possible interactions. Give your health care provider a list of all the medicines, herbs, non-prescription drugs, or dietary supplements you use. Also tell them if you smoke, drink  alcohol, or use illegal drugs. Some items may interact with your medicine. What should I watch for while using this medicine? Tell your doctor if your symptoms   do not get better or if they get worse. Visit your doctor or health care professional for regular checks on your progress. Because it may take several weeks to see the full effects of this medicine, it is important to continue your treatment as prescribed by your doctor. Patients and their families should watch out for new or worsening thoughts of suicide or depression. Also watch out for sudden changes in feelings such as feeling anxious, agitated, panicky, irritable, hostile, aggressive, impulsive, severely restless, overly excited and hyperactive, or not being able to sleep. If this happens, especially at the beginning of treatment or after a change in dose, call your health care professional. Bonita QuinYou may get drowsy or dizzy. Do not drive, use machinery, or do anything that needs mental alertness until you know how this medicine affects you. Do not stand or sit up quickly, especially if you are an older patient. This reduces the risk of dizzy or fainting spells. Alcohol may interfere with the effect of this medicine. Avoid alcoholic drinks. Your mouth may get dry. Chewing sugarless gum or sucking hard candy, and drinking plenty of water may help. Contact your doctor if the problem does not go away or is severe. What side effects may I notice from receiving this medicine? Side effects that you should report to your doctor or health care professional as soon as possible: -allergic reactions like skin rash, itching or hives, swelling of the face, lips, or tongue -anxious -black, tarry stools -changes in vision -confusion -elevated mood, decreased need for sleep, racing thoughts, impulsive behavior -eye pain -fast, irregular heartbeat -feeling faint or lightheaded, falls -feeling agitated, angry, or irritable -hallucination, loss of contact with  reality -loss of balance or coordination -loss of memory -painful or prolonged erections -restlessness, pacing, inability to keep still -seizures -stiff muscles -suicidal thoughts or other mood changes -trouble sleeping -unusual bleeding or bruising -unusually weak or tired -vomiting Side effects that usually do not require medical attention (report to your doctor or health care professional if they continue or are bothersome): -change in appetite or weight -change in sex drive or performance -diarrhea -increased sweating -indigestion, nausea -tremors This list may not describe all possible side effects. Call your doctor for medical advice about side effects. You may report side effects to FDA at 1-800-FDA-1088. Where should I keep my medicine? Keep out of the reach of children. Store at room temperature between 15 and 30 degrees C (59 and 86 degrees F). Throw away any unused medicine after the expiration date. NOTE: This sheet is a summary. It may not cover all possible information. If you have questions about this medicine, talk to your doctor, pharmacist, or health care provider.  2018 Elsevier/Gold Standard (2016-08-10 14:17:49)  What You Need to Know About Female Sterilization Female sterilization is surgery to prevent pregnancy. In this surgery, the fallopian tubes are either blocked or closed off. This prevents eggs from reaching the uterus so that the eggs cannot be fertilized by sperm and you cannot get pregnant. Sterilization is permanent. It should only be done if you are sure that you do not want to be able to have children. What are the sterilization surgery options? There are several kinds of female sterilization surgeries. They include:  Laparoscopic tubal ligation. In this surgery, the fallopian tubes are tied off, sealed with heat, or blocked with a clip, ring, or clamp. A small portion of each fallopian tube may also be removed. This surgery is done through several  small cuts (incisions).  Postpartum tubal ligation. This is also called a mini-laparotomy. This surgery is done right after childbirth or 1 or 2 days after childbirth. In this surgery, the fallopian tubes are tied off, sealed with heat, or blocked with a clip, ring, or clamp. A small portion of each fallopian tube may also be removed. The surgery is done through a single incision.  Hysteroscopic sterilization. In this surgery, a tiny, spring-like coil is inserted through the cervix and uterus into the fallopian tubes. The coil causes scarring, which blocks the tubes. After the surgery, contraception should be used for 3 months to allow the scar tissue to form completely.  Is sterilization safe? Generally, sterilization is safe. Complications are rare. However, there are risks. They include:  Bleeding.  Infection.  Reaction to medicine used during the procedure.  Injury to surrounding organs.  Failure of the procedure.  How effective is sterilization? Sterilization is nearly 100% effective, but it can fail. Also, the fallopian tubes can grow back together over time. If this happens, you will be able to get pregnant again. Women who have had this procedure have a higher chance of having an ectopic pregnancy. An ectopic pregnancy is a pregnancy that happens outside of the uterus. This kind of pregnancy is unsuccessful and can lead to serious bleeding if it is not treated. What are the benefits?  It is usually effective for a lifetime.  It is usually safe.  It does not have the drawbacks of other types of birth control: That means: ? Your hormones are not affected. Because of this, your menstrual periods, sexual desire, and sexual performance will not be affected. ? There are no side effects. What are the drawbacks?  If you change your mind and decide that you want to have children, you may not be able to. Sterilization may be reversed, but a reversal is not always successful.  It does  not provide protection against STDs (sexually transmitted diseases).  It increases the chance of having an ectopic pregnancy. This information is not intended to replace advice given to you by your health care provider. Make sure you discuss any questions you have with your health care provider. Document Released: 01/23/2008 Document Revised: 03/29/2016 Document Reviewed: 05/03/2015 Elsevier Interactive Patient Education  [employer].

## 2017-06-14 NOTE — Progress Notes (Signed)
GYN ENCOUNTER NOTE  Subjective:       Sara Webb is a 26 y.o. [phone] female here for medication management.   Started Zoloft for postpartum depression on 05/17/2017. Feeling better. Denies suicidal or homicidal ideations.   Reports continuous vaginal spotting while taking POP. Okay with continuing to monitor until BTL scheduled with Dr. Valentino Saxon in November.   Denies difficulty breathing or respiratory distress, chest pain, abdominal pain, dysuria, and leg pain or swelling.   History significant for headaches without aura.    Gynecologic History  No LMP recorded.  Contraception: oral progesterone-only contraceptive  Last Pap: 2017. Results were: normal  Obstetric History  OB History  Gravida Para Term Preterm AB Living  4 3 3   1 3   SAB TAB Ectopic Multiple Live Births      1 0 3    # Outcome Date GA Lbr Len/2nd Weight Sex Delivery Anes PTL Lv  4 Term 04/09/17 [redacted]w[redacted]d / 00:24 6 lb 13 oz (3.09 kg) M Vag-Spont None  LIV  3 Ectopic 2015        ND  2 Term     M Vag-Spont   LIV  1 Term     M Vag-Spont   LIV      Past Medical History:  Diagnosis Date  . migraine     Past Surgical History:  Procedure Laterality Date  . LAPAROSCOPIC OVARIAN CYSTECTOMY Right 05/18/2016   Procedure: LAPAROSCOPIC OVARIAN CYSTECTOMY;  Surgeon: Nadara Mustard, MD;  Location: [facility] ORS;  Service: Gynecology;  Laterality: Right;  . NO PAST SURGERIES      Current Outpatient Prescriptions on File Prior to Visit  Medication Sig Dispense Refill  . norethindrone (MICRONOR,CAMILA,ERRIN) 0.35 MG tablet Take 1 tablet (0.35 mg total) by mouth daily. 1 Package 11  . Prenatal Vit-Fe Fumarate-FA (MULTIVITAMIN-PRENATAL) 27-0.8 MG TABS tablet Take 1 tablet by mouth daily at 12 noon.    . sertraline (ZOLOFT) 50 MG tablet Take 1 tablet (50 mg total) by mouth daily. 30 tablet 1  . ibuprofen (ADVIL,MOTRIN) 800 MG tablet Take 1 tablet (800 mg total) by mouth every 8 (eight) hours. (Patient not taking: Reported  on 06/14/2017) 30 tablet 0   No current facility-administered medications on file prior to visit.     Allergies  Allergen Reactions  . Sulfa Antibiotics     Childhood allergy    Social History   Social History  . Marital status: Married    Spouse name: N/A  . Number of children: N/A  . Years of education: N/A   Occupational History  . Not on file.   Social History Main Topics  . Smoking status: Never Smoker  . Smokeless tobacco: Never Used  . Alcohol use No  . Drug use: No  . Sexual activity: Yes    Birth control/ protection: None   Other Topics Concern  . Not on file   Social History Narrative  . No narrative on file    Family History  Problem Relation Age of Onset  . Cancer Paternal Aunt        melanoma    The following portions of the patient's history were reviewed and updated as appropriate: allergies, current medications, past family history, past medical history, past social history, past surgical history and problem list.  Review of Systems Review of Systems - Negative except as noted above. History obtained from the patient.  Objective:   BP 100/72   Pulse 80   Breastfeeding? No  Alert and oriented x 4, no apparent distress.   Depression screen [place] 2/9 06/14/2017  Decreased Interest 0  Down, Depressed, Hopeless 0  PHQ - 2 Score 0  Altered sleeping 0  Tired, decreased energy 0  Change in appetite 0  Feeling bad or failure about yourself  0  Trouble concentrating 0  Moving slowly or fidgety/restless 0  PHQ-9 Score 0  Difficult doing work/chores Not difficult at all   Assessment:   1. Medication management  Plan:   Continue medication as prescribed.   Ex: Estradiol, see orders.   Follow up with Dr. Valentino Saxonherry as previously scheduled to discuss bilateral tubal ligation.   Reviewed red flag symptoms and when to call.   RTC as needed.    Gunnar BullaJenkins Michelle Lawhorn, CNM

## 2017-06-18 ENCOUNTER — Encounter: Payer: Self-pay | Admitting: Certified Nurse Midwife

## 2017-06-22 MED ORDER — ESTRADIOL 1 MG PO TABS
1.0000 mg | ORAL_TABLET | Freq: Every day | ORAL | 0 refills | Status: DC
Start: 2017-06-22 — End: 2017-07-04

## 2017-06-27 ENCOUNTER — Encounter: Payer: Self-pay | Admitting: Obstetrics and Gynecology

## 2017-06-27 ENCOUNTER — Ambulatory Visit (INDEPENDENT_AMBULATORY_CARE_PROVIDER_SITE_OTHER): Payer: 59 | Admitting: Obstetrics and Gynecology

## 2017-06-27 VITALS — BP 104/69 | HR 66 | Ht 67.0 in | Wt 149.3 lb

## 2017-06-27 DIAGNOSIS — N938 Other specified abnormal uterine and vaginal bleeding: Secondary | ICD-10-CM | POA: Diagnosis not present

## 2017-06-27 DIAGNOSIS — Z01818 Encounter for other preprocedural examination: Secondary | ICD-10-CM | POA: Diagnosis not present

## 2017-06-27 NOTE — Patient Instructions (Signed)
Laparoscopic Tubal Ligation Laparoscopic tubal ligation is a procedure to close the fallopian tubes. This is done so that you cannot get pregnant. When the fallopian tubes are closed, the eggs that your ovaries release cannot enter the uterus, and sperm cannot reach the released eggs. A laparoscopic tubal ligation is sometimes called "getting your tubes tied." You should not have this procedure if you want to get pregnant someday or if you are unsure about having more children. Tell a health care provider about:  Any allergies you have.  All medicines you are taking, including vitamins, herbs, eye drops, creams, and over-the-counter medicines.  Any problems you or family members have had with anesthetic medicines.  Any blood disorders you have.  Any surgeries you have had.  Any medical conditions you have.  Whether you are pregnant or may be pregnant.  Any past pregnancies. What are the risks? Generally, this is a safe procedure. However, problems may occur, including:  Infection.  Bleeding.  Injury to surrounding organs.  Side effects from anesthetics.  Failure of the procedure.  This procedure can increase your risk of a kind of pregnancy in which a fertilized egg attaches to the outside of the uterus (ectopic pregnancy). What happens before the procedure?  Ask your health care provider about: ? Changing or stopping your regular medicines. This is especially important if you are taking diabetes medicines or blood thinners. ? Taking medicines such as aspirin and ibuprofen. These medicines can thin your blood. Do not take these medicines before your procedure if your health care provider instructs you not to.  Follow instructions from your health care provider about eating and drinking restrictions.  Plan to have someone take you home after the procedure.  If you go home right after the procedure, plan to have someone with you for 24 hours. What happens during the  procedure?  You will be given one or more of the following: ? A medicine to help you relax (sedative). ? A medicine to numb the area (local anesthetic). ? A medicine to make you fall asleep (general anesthetic). ? A medicine that is injected into an area of your body to numb everything below the injection site (regional anesthetic).  An IV tube will be inserted into one of your veins. It will be used to give you medicines and fluids during the procedure.  Your bladder may be emptied with a small tube (catheter).  If you have been given a general anesthetic, a tube will be put down your throat to help you breathe.  Two small cuts (incisions) will be made in your lower abdomen and near your belly button.  Your abdomen will be inflated with a gas. This will let the surgeon see better and will give the surgeon room to work.  A thin, lighted tube (laparoscope) with a camera attached will be inserted into your abdomen through one of the incisions. Small instruments will be inserted through the other incision.  The fallopian tubes will be tied off, burned (cauterized), or blocked with a clip, ring, or clamp. A small portion in the center of each fallopian tube may be removed.  The gas will be released from the abdomen.  The incisions will be closed with stitches (sutures).  A bandage (dressing) will be placed over the incisions. The procedure may vary among health care providers and hospitals. What happens after the procedure?  Your blood pressure, heart rate, breathing rate, and blood oxygen level will be monitored often until the medicines you   were given have worn off.  You will be given medicine to help with pain, nausea, and vomiting as needed. This information is not intended to replace advice given to you by your health care provider. Make sure you discuss any questions you have with your health care provider. Document Released: 11/12/2000 Document Revised: 01/12/2016 Document  Reviewed: 07/17/2015 Elsevier Interactive Patient Education  2018 Elsevier Inc.  

## 2017-06-27 NOTE — H&P (Signed)
GYNECOLOGY PREOPERATIVE HISTORY AND PHYSICAL   Subjective:  Sara Webb is a 26 y.o. [phone] [id number] here for desired permanent sterilization.  She is approximately 3 months postpartum. No significant preoperative concerns.  Proposed surgery: Laparoscopic bilateral salpingectomy   Pertinent Gynecological History: Menses: flow is moderate and most recent period has lasted 3 weeks.  Contraception: oral progesterone-only contraceptive Last pap: normal Date: 11/04/2015   Past Medical History:  Diagnosis Date  . migraine     Past Surgical History:  Procedure Laterality Date  . NO PAST SURGERIES      OB History  Gravida Para Term Preterm AB Living  4 3 3   1 3   SAB TAB Ectopic Multiple Live Births      1 0 3    # Outcome Date GA Lbr Len/2nd Weight Sex Delivery Anes PTL Lv  4 Term 04/09/17 1896w3d / 00:24 6 lb 13 oz (3.09 kg) M Vag-Spont None  LIV  3 Ectopic 2015        ND  2 Term     M Vag-Spont   LIV  1 Term     M Vag-Spont   LIV      Family History  Problem Relation Age of Onset  . Cancer Paternal Aunt        melanoma    Social History   Socioeconomic History  . Marital status: Married    Spouse name: Not on file  . Number of children: Not on file  . Years of education: Not on file  . Highest education level: Not on file  Social Needs  . Financial resource strain: Not on file  . Food insecurity - worry: Not on file  . Food insecurity - inability: Not on file  . Transportation needs - medical: Not on file  . Transportation needs - non-medical: Not on file  Occupational History  . Not on file  Tobacco Use  . Smoking status: Never Smoker  . Smokeless tobacco: Never Used  Substance and Sexual Activity  . Alcohol use: No  . Drug use: No  . Sexual activity: Yes    Birth control/protection: None  Other Topics Concern  . Not on file  Social History Narrative  . Not on file    Current Outpatient Medications on File Prior to Visit  Medication Sig Dispense  Refill  . estradiol (ESTRACE) 1 MG tablet Take 1 tablet (1 mg total) by mouth daily. 30 tablet 0  . Multiple Vitamin (MULTI-VITAMINS) TABS Take by mouth.    . norethindrone (MICRONOR,CAMILA,ERRIN) 0.35 MG tablet Take 1 tablet (0.35 mg total) by mouth daily. 1 Package 11  . sertraline (ZOLOFT) 50 MG tablet Take 1 tablet (50 mg total) by mouth daily. 30 tablet 1   No current facility-administered medications on file prior to visit.     Allergies  Allergen Reactions  . Sulfa Antibiotics     Childhood allergy     Review of Systems Constitutional: No recent fever/chills/sweats Respiratory: No recent cough/bronchitis Cardiovascular: No chest pain Gastrointestinal: No recent nausea/vomiting/diarrhea Genitourinary: No UTI symptoms Hematologic/lymphatic:No history of coagulopathy or recent blood thinner use    Objective:   Blood pressure 104/69, pulse 66, height 5\' 7"  (1.702 m), weight 149 lb 4.8 oz (67.7 kg), last menstrual period 06/07/2017, not currently breastfeeding. CONSTITUTIONAL: Well-developed, well-nourished female in no acute distress.  HENT:  Normocephalic, atraumatic, External right and left ear normal. Oropharynx is clear and moist EYES: Conjunctivae and EOM are normal. Pupils are equal,  round, and reactive to light. No scleral icterus.  NECK: Normal range of motion, supple, no masses SKIN: Skin is warm and dry. No rash noted. Not diaphoretic. No erythema. No pallor. NEUROLOGIC: Alert and oriented to person, place, and time. Normal reflexes, muscle tone coordination. No cranial nerve deficit noted. PSYCHIATRIC: Normal mood and affect. Normal behavior. Normal judgment and thought content. CARDIOVASCULAR: Normal heart rate noted, regular rhythm RESPIRATORY: Effort and breath sounds normal, no problems with respiration noted ABDOMEN: Soft, nontender, nondistended. PELVIC: Deferred MUSCULOSKELETAL: Normal range of motion. No edema and no tenderness. 2+ distal  pulses.    Labs: Lab Results  Component Value Date   WBC 6.1 05/17/2017   HGB 13.0 05/17/2017   HCT 40.0 05/17/2017   MCV 90 05/17/2017   PLT 310 05/17/2017     Imaging Studies: No results found.  Assessment:    Multiparity, desiring permanent sterilization   Plan:    Counseling: Procedure, risks, reasons, benefits and complications (including injury to bowel, bladder, major blood vessel, ureter, bleeding, possibility of transfusion, infection, or fistula formation) reviewed in detail. Likelihood of success in alleviating the patient's condition was discussed. Routine postoperative instructions will be reviewed with the patient and her family in detail after surgery.  The patient concurred with the proposed plan, giving informed written consent for the surgery.   Preop testing ordered. Instructions reviewed, including NPO after midnight.   Hildred Laserherry, Alcario Tinkey, MD [facility]

## 2017-06-27 NOTE — Progress Notes (Signed)
GYNECOLOGY PROGRESS NOTE  Subjective:    Patient ID: Sara Webb, female    DOB: 03/15/1991, 26 y.o.   MRN: [id number]  HPI  Patient is a 26 y.o. [phone] female who presents for counseling for tubal ligation.  Patient is approximately 3 months postpartum s/p SVD.  Notes that she and husband have discussed this as this was last pregnancy was a surprise, and they now have 3 children of the same sex and do not desire to continue trying.   Of note, patient notes that she has been having irregular bleeding over the past 3 weeks.  She was placed on progesterone-only pills after her postpartum visit.  States that when her period came on, it never stopped.  Contacted her provider Serafina Royals(Michelle Lawhorn, CNM) who recently prescribed estradiol tablets.  Notes she just started on medication this week, but has not noticed much of a change in her cycle.   The following portions of the patient's history were reviewed and updated as appropriate: allergies, current medications, past family history, past medical history, past social history, past surgical history and problem list.  Review of Systems Pertinent items noted in HPI and remainder of comprehensive ROS otherwise negative.   Objective:   Blood pressure 104/69, pulse 66, height 5\' 7"  (1.702 m), weight 149 lb 4.8 oz (67.7 kg), last menstrual period 06/07/2017, not currently breastfeeding. General appearance: alert and no distress Abdomen: soft, non-tender; bowel sounds normal; no masses,  no organomegaly Pelvic: cervix normal in appearance, external genitalia normal, no adnexal masses or tenderness, no cervical motion tenderness, rectovaginal septum normal, uterus normal size, shape, and consistency and vagina normal without discharge See H&P for remainder of exam.    Assessment:   Encounter for tubal ligation pre-operative exam Dysfunctional uterine bleeding  Plan:   - Patient desires bilateral tubal sterilization.  Other reversible forms of  contraception were discussed with patient; she declines all other modalities. Discussed bilateral tubal sterilization in detail; discussed options of laparoscopic bilateral tubal sterilization using Filshie clips vs laparoscopic bilateral salpingectomy. Risks and benefits discussed in detail including but not limited to: risk of regret, permanence of method, bleeding, infection, injury to surrounding organs and need for additional procedures.  Failure risk of 1-2 % for Filshie clips and <1% for bilateral salpingectomy with increased risk of ectopic gestation if pregnancy occurs was also discussed with patient.  Also discussed possible reduction of risk of ovarian cancer via bilateral salpingectomy given that a growing body of knowledge reveals that the majority of cases of high grade serous "ovarian" cancer actually are actually  cancers arising from the fimbriated end of the fallopian tubes. Emphasized that removal of fallopian tubes do not result in any known hormonal imbalance.  Patient verbalized understanding of these risks and benefits and wants to proceed with sterilization with laparoscopic bilateral sterilization using salpingectomy.   She was told that she will be contacted by our surgical scheduler regarding the time and date of her surgery; routine preoperative instructions of having nothing to eat or drink after midnight on the day prior to surgery and also coming to the hospital 1 1/2 hours prior to her time of surgery were also emphasized.  She was told she may be called for a preoperative appointment about a week prior to surgery and will be given further preoperative instructions at that visit.  Routine postoperative instructions will be reviewed with the patient and her family in detail after surgery. Printed patient education handouts about the procedure was  given to the patient to review at home.  Surgery scheduled for 07/15/2017.  In the meantime, patient will use progesterone OCPs for  contraception prior to surgery. - Discussed dysfunctional uterine bleeding likely caused by progesterone OCPs. Advised to continue estradiol tablets for a total of 2 weeks.  If this does not help symptoms, can discontinue the tablets and use condoms or abstinence for contraceptive method until surgery completed.    A total of 15 minutes were spent face-to-face with the patient during this encounter and over half of that time dealt with counseling and coordination of care.  Hildred Laserherry, Jushua Waltman, MD [facility]

## 2017-06-27 NOTE — Addendum Note (Signed)
Addended by: Fabian NovemberHERRY, Oliviagrace Crisanti S on: 06/27/2017 04:49 PM   Modules accepted: Orders, SmartSet

## 2017-07-04 ENCOUNTER — Other Ambulatory Visit (INDEPENDENT_AMBULATORY_CARE_PROVIDER_SITE_OTHER): Payer: 59

## 2017-07-04 ENCOUNTER — Encounter: Payer: Self-pay | Admitting: Certified Nurse Midwife

## 2017-07-04 ENCOUNTER — Other Ambulatory Visit: Payer: Self-pay | Admitting: Certified Nurse Midwife

## 2017-07-04 ENCOUNTER — Ambulatory Visit (INDEPENDENT_AMBULATORY_CARE_PROVIDER_SITE_OTHER): Payer: 59 | Admitting: Certified Nurse Midwife

## 2017-07-04 VITALS — BP 109/74 | HR 67 | Ht 67.0 in | Wt 149.3 lb

## 2017-07-04 DIAGNOSIS — R1031 Right lower quadrant pain: Secondary | ICD-10-CM

## 2017-07-04 DIAGNOSIS — Z8742 Personal history of other diseases of the female genital tract: Secondary | ICD-10-CM | POA: Diagnosis not present

## 2017-07-04 DIAGNOSIS — N939 Abnormal uterine and vaginal bleeding, unspecified: Secondary | ICD-10-CM

## 2017-07-04 MED ORDER — MEDROXYPROGESTERONE ACETATE 10 MG PO TABS: 10.0000 mg | ORAL_TABLET | Freq: Every day | ORAL | 0 refills | Status: DC

## 2017-07-04 NOTE — Progress Notes (Signed)
GYN ENCOUNTER NOTE  Subjective:       Sara Webb is a 26 y.o. [phone] female is here for gynecologic evaluation of the following issues:  1. Abnormal uterine bleeding 2. Right lower quadrant pain 3. History of ovarian cyst  Reports continuous spotting and intermittent right lower abdominal abdominal pain since postpartum visit. No relief with camila and estradiol.   Denies difficulty breathing or respiratory distress, chest pain, dysuria, excessive vaginal bleeding, and leg pain or swelling.    Gynecologic History  Patient's last menstrual period was 06/07/2017.   Contraception: oral progesterone-only contraceptive; scheduled for BTL on 07/15/2017.  Last Pap: 2017. Results were: normal  Obstetric History  OB History  Gravida Para Term Preterm AB Living  4 3 3   1 3   SAB TAB Ectopic Multiple Live Births      1 0 3    # Outcome Date GA Lbr Len/2nd Weight Sex Delivery Anes PTL Lv  4 Term 04/09/17 [id number] / 00:24 6 lb 13 oz (3.09 kg) M Vag-Spont None  LIV  3 Ectopic 2015        ND  2 Term     M Vag-Spont   LIV  1 Term     M Vag-Spont   LIV      Past Medical History:  Diagnosis Date  . migraine     Past Surgical History:  Procedure Laterality Date  . LAPAROSCOPIC OVARIAN CYSTECTOMY Right 05/18/2016   Procedure: LAPAROSCOPIC OVARIAN CYSTECTOMY;  Surgeon: Nadara Mustardobert P Harris, MD;  Location: [facility] ORS;  Service: Gynecology;  Laterality: Right;  . NO PAST SURGERIES      Current Outpatient Medications on File Prior to Visit  Medication Sig Dispense Refill  . acetaminophen (TYLENOL) 325 MG tablet Take 325-650 mg every 6 (six) hours as needed by mouth for moderate pain or headache.    . Multiple Vitamin (MULTI-VITAMINS) TABS Take 1 tablet daily by mouth.     . sertraline (ZOLOFT) 50 MG tablet Take 1 tablet (50 mg total) by mouth daily. 30 tablet 1   No current facility-administered medications on file prior to visit.     Allergies  Allergen Reactions  . Diphenhydramine  Hcl Other (See Comments)    hyper   . Sulfa Antibiotics Other (See Comments)    Childhood allergy    Social History   Socioeconomic History  . Marital status: Married    Spouse name: Not on file  . Number of children: Not on file  . Years of education: Not on file  . Highest education level: Not on file  Social Needs  . Financial resource strain: Not on file  . Food insecurity - worry: Not on file  . Food insecurity - inability: Not on file  . Transportation needs - medical: Not on file  . Transportation needs - non-medical: Not on file  Occupational History  . Not on file  Tobacco Use  . Smoking status: Never Smoker  . Smokeless tobacco: Never Used  Substance and Sexual Activity  . Alcohol use: No  . Drug use: No  . Sexual activity: Yes    Birth control/protection: None  Other Topics Concern  . Not on file  Social History Narrative  . Not on file    Family History  Problem Relation Age of Onset  . Cancer Paternal Aunt        melanoma    The following portions of the patient's history were reviewed and updated as appropriate: allergies, current  medications, past family history, past medical history, past social history, past surgical history and problem list.  Review of Systems  Review of Systems - Negative except as noted above.  History obtained from the patient  Objective:   BP 109/74 (BP Location: Right Arm, Patient Position: Sitting, Cuff Size: Normal)   Pulse 67   Ht 5\' 7"  (1.702 m)   Wt 149 lb 4.8 oz (67.7 kg)   LMP 06/07/2017   BMI 23.38 kg/m   CONSTITUTIONAL: Well-developed, well-nourished female in no acute distress.   HENT:  Normocephalic, atraumatic.   NECK: Normal range of motion, supple, no masses.    SKIN: Skin is warm and dry. No rash noted. Not diaphoretic. No erythema. No pallor.  NEUROLGIC: Alert and oriented to person, place, and time.   PSYCHIATRIC: Normal mood and affect. Normal behavior. Normal judgment and thought  content.  MUSCULOSKELETAL: Normal range of motion. No tenderness.  No cyanosis, clubbing, or edema.  ULTRASOUND REPORT  Location: [facility] Date of Service:  07/04/17  Indications: RLQ Pain Findings:  The uterus measures 10.5 x 6.5 x 4.4 cm. Echo texture is homogeneous without evidence of focal masses.  Prominent vessels are noted within the uterus. The Endometrium measures 3.6 mm.  Right Ovary measures 1.8 x 1.7 x 1.5 cm and appears WNL.  Left Ovary measures 1.8 x 1.4 x 1.1 cm and appears WNL. (Seen transabdominally only) Survey of the adnexa demonstrates no adnexal masses. There is no free fluid in the cul de sac.  Impression: 1. Anteverted uterus appears of normal size and contour.  2. Endometrium measures 3.6 mm. 3. Prominent uterine vessels noted. 4. Bilateral ovaries appear WNL.  Recommendations: 1.Clinical correlation with the patient's History and Physical Exam.   Assessment:   1. Abnormal uterine bleeding  2. History of ovarian cyst  3. Right lower quadrant pain  Plan:   Ultrasound findings reviewed with patient via telephone and MyChart.   Rx: Provera, see orders.   Discussed home treatment measures including Motrin and Tylenol.   Reviewed red flag symptoms and when to call.   Follow up as needed.    Gunnar BullaJenkins Michelle Nastacia Raybuck, CNM

## 2017-07-05 ENCOUNTER — Telehealth: Payer: Self-pay | Admitting: Certified Nurse Midwife

## 2017-07-05 NOTE — Telephone Encounter (Signed)
HIPPA approved message left on identified line. Advised patient will send message via MyChart or she may call office back.    Gunnar BullaJenkins Michelle Estefanny Moler, CNM [facility], [facility]

## 2017-07-06 ENCOUNTER — Encounter: Payer: Self-pay | Admitting: Certified Nurse Midwife

## 2017-07-06 ENCOUNTER — Other Ambulatory Visit: Payer: Self-pay | Admitting: Certified Nurse Midwife

## 2017-07-08 ENCOUNTER — Other Ambulatory Visit: Payer: Self-pay

## 2017-07-08 ENCOUNTER — Encounter
Admission: RE | Admit: 2017-07-08 | Discharge: 2017-07-08 | Disposition: A | Discharge status: Home / Self Care | Payer: 59 | Source: Ambulatory Visit | Attending: Obstetrics and Gynecology | Admitting: Obstetrics and Gynecology

## 2017-07-08 HISTORY — DX: Family history of other specified conditions: Z84.89

## 2017-07-08 NOTE — Patient Instructions (Signed)
Your procedure is scheduled on: 07/15/17 Report to DAY SURGERY. 2ND FLOOR MEDICAL MALL ENTRANCE. To find out your arrival time please call [phone] between 1PM - 3PM on 07/12/17.  Remember: Instructions that are not followed completely may result in serious medical risk, up to and including death, or upon the discretion of your surgeon and anesthesiologist your surgery may need to be rescheduled.    __X__ 1. Do not eat anything after midnight the night before your    procedure.  No gum chewing or hard candies.  You may drink clear   liquids up to 2 hours before you are scheduled to arrive at the   hospital for your procedure. Do not drink clear liquids within 2   hours of scheduled arrival to the hospital as this may lead to your   procedure being delayed or rescheduled.       Clear liquids include:   Water or Apple juice without pulp   Clear carbohydrate beverage such as Clearfast or Gatorade   Black coffee or Clear Tea (no milk, no creamer, do not add anything   to the coffee or tea)    Diabetics should only drink water   __X__ 2. No Alcohol for 24 hours before or after surgery.   ____ 3. Bring all medications with you on the day of surgery if instructed.    __X__ 4. Notify your doctor if there is any change in your medical condition     (cold, fever, infections).             __X___5. No smoking within 24 hours of your surgery.     Do not wear jewelry, make-up, hairpins, clips or nail polish.  Do not wear lotions, powders, or perfumes.   Do not shave 48 hours prior to surgery. Men may shave face and neck.  Do not bring valuables to the hospital.    [facility] any belongings or valuables.               Contacts, dentures or bridgework may not be worn into surgery.  Leave your suitcase in the car. After surgery it may be brought to your room.  For patients admitted to the hospital, discharge time is determined by your                treatment  team.   Patients discharged the day of surgery will not be allowed to drive home.   Please read over the following fact sheets that you were given:   MRSA Information   __X__ Take these medicines the morning of surgery with A SIP OF WATER:    1. SERTRALINE  2.   3.   4.  5.  6.  ____ Fleet Enema (as directed)   __X__ Use CHG Soap/SAGE wipes as directed  ____ Use inhalers on the day of surgery  ____ Stop metformin 2 days prior to surgery    ____ Take 1/2 of usual insulin dose the night before surgery and none on the morning of surgery.   ____ Stop Coumadin/Plavix/aspirin on   __X__ Stop Anti-inflammatories such as Advil, Aleve, Ibuprofen, Motrin, Naproxen, Naprosyn, Goodies,powder, or aspirin products.  OK to take Tylenol.   __X__ Stop supplements, Vitamin E, Fish Oil until after surgery.    ____ Bring C-Pap to the hospital.

## 2017-07-09 ENCOUNTER — Encounter
Admission: RE | Admit: 2017-07-09 | Discharge: 2017-07-09 | Disposition: A | Discharge status: Home / Self Care | Payer: 59 | Source: Ambulatory Visit | Attending: Obstetrics and Gynecology | Admitting: Obstetrics and Gynecology

## 2017-07-09 DIAGNOSIS — O9081 Anemia of the puerperium: Secondary | ICD-10-CM | POA: Diagnosis not present

## 2017-07-09 LAB — CBC
HCT: 40.1 % (ref 35.0–47.0)
Hemoglobin: 13.7 g/dL (ref 12.0–16.0)
MCH: 30.9 pg (ref 26.0–34.0)
MCHC: 34.2 g/dL (ref 32.0–36.0)
MCV: 90.4 fL (ref 80.0–100.0)
Platelets: 278 10*3/uL (ref 150–440)
RBC: 4.43 MIL/uL (ref 3.80–5.20)
RDW: 13.6 % (ref 11.5–14.5)
WBC: 4.6 10*3/uL (ref 3.6–11.0)

## 2017-07-12 ENCOUNTER — Encounter: Payer: Self-pay | Admitting: Certified Nurse Midwife

## 2017-07-15 ENCOUNTER — Ambulatory Visit: Payer: 59 | Admitting: Anesthesiology

## 2017-07-15 ENCOUNTER — Encounter
Admission: RE | Disposition: A | Discharge status: Home / Self Care | Payer: Self-pay | Source: Ambulatory Visit | Attending: Obstetrics and Gynecology

## 2017-07-15 ENCOUNTER — Encounter: Payer: Self-pay | Admitting: *Deleted

## 2017-07-15 ENCOUNTER — Ambulatory Visit
Admission: RE | Admit: 2017-07-15 | Discharge: 2017-07-15 | Disposition: A | Discharge status: Home / Self Care | Payer: 59 | Source: Ambulatory Visit | Attending: Obstetrics and Gynecology | Admitting: Obstetrics and Gynecology

## 2017-07-15 ENCOUNTER — Telehealth: Payer: Self-pay | Admitting: Certified Nurse Midwife

## 2017-07-15 ENCOUNTER — Other Ambulatory Visit: Payer: Self-pay

## 2017-07-15 DIAGNOSIS — Z79899 Other long term (current) drug therapy: Secondary | ICD-10-CM | POA: Insufficient documentation

## 2017-07-15 DIAGNOSIS — K668 Other specified disorders of peritoneum: Secondary | ICD-10-CM | POA: Insufficient documentation

## 2017-07-15 DIAGNOSIS — Z79890 Hormone replacement therapy (postmenopausal): Secondary | ICD-10-CM | POA: Insufficient documentation

## 2017-07-15 DIAGNOSIS — Z302 Encounter for sterilization: Secondary | ICD-10-CM | POA: Diagnosis not present

## 2017-07-15 DIAGNOSIS — N809 Endometriosis, unspecified: Secondary | ICD-10-CM | POA: Diagnosis present

## 2017-07-15 DIAGNOSIS — N938 Other specified abnormal uterine and vaginal bleeding: Secondary | ICD-10-CM | POA: Insufficient documentation

## 2017-07-15 DIAGNOSIS — N803 Endometriosis of pelvic peritoneum: Secondary | ICD-10-CM | POA: Insufficient documentation

## 2017-07-15 DIAGNOSIS — Z793 Long term (current) use of hormonal contraceptives: Secondary | ICD-10-CM | POA: Diagnosis not present

## 2017-07-15 HISTORY — PX: LAPAROSCOPIC TUBAL LIGATION: SHX1937

## 2017-07-15 HISTORY — PX: DILATION AND CURETTAGE OF UTERUS: SHX78

## 2017-07-15 SURGERY — LIGATION, FALLOPIAN TUBE, LAPAROSCOPIC
Anesthesia: General | Site: Vagina

## 2017-07-15 MED ORDER — PROPOFOL 10 MG/ML IV BOLUS
INTRAVENOUS | Status: DC | PRN
  Administered 2017-07-15: 150 mg via INTRAVENOUS
  Administered 2017-07-15: 30 mg via INTRAVENOUS
  Administered 2017-07-15: 20 mg via INTRAVENOUS

## 2017-07-15 MED ORDER — SUGAMMADEX SODIUM 200 MG/2ML IV SOLN
INTRAVENOUS | Status: AC
  Filled 2017-07-15: qty 2

## 2017-07-15 MED ORDER — ACETAMINOPHEN NICU IV SYRINGE 10 MG/ML
INTRAVENOUS | Status: AC
  Filled 2017-07-15: qty 1

## 2017-07-15 MED ORDER — SERTRALINE HCL 50 MG PO TABS: 50.0000 mg | ORAL_TABLET | Freq: Every day | ORAL | 1 refills | Status: DC

## 2017-07-15 MED ORDER — DEXAMETHASONE SODIUM PHOSPHATE 10 MG/ML IJ SOLN
INTRAMUSCULAR | Status: DC | PRN
  Administered 2017-07-15: 10 mg via INTRAVENOUS

## 2017-07-15 MED ORDER — FAMOTIDINE 20 MG PO TABS
20.0000 mg | ORAL_TABLET | Freq: Once | ORAL | Status: AC
  Administered 2017-07-15: 20 mg via ORAL

## 2017-07-15 MED ORDER — ACETAMINOPHEN 10 MG/ML IV SOLN
INTRAVENOUS | Status: DC | PRN
  Administered 2017-07-15: 1000 mg via INTRAVENOUS

## 2017-07-15 MED ORDER — DEXAMETHASONE SODIUM PHOSPHATE 10 MG/ML IJ SOLN
INTRAMUSCULAR | Status: AC
  Filled 2017-07-15: qty 1

## 2017-07-15 MED ORDER — IBUPROFEN 800 MG PO TABS: 800.0000 mg | ORAL_TABLET | Freq: Three times a day (TID) | ORAL | 1 refills | Status: DC | PRN

## 2017-07-15 MED ORDER — LIDOCAINE HCL (CARDIAC) 20 MG/ML IV SOLN
INTRAVENOUS | Status: DC | PRN
  Administered 2017-07-15: 80 mg via INTRAVENOUS

## 2017-07-15 MED ORDER — ONDANSETRON HCL 4 MG/2ML IJ SOLN
INTRAMUSCULAR | Status: AC
  Filled 2017-07-15: qty 2

## 2017-07-15 MED ORDER — OXYCODONE-ACETAMINOPHEN 5-325 MG PO TABS: 1.0000 | ORAL_TABLET | Freq: Four times a day (QID) | ORAL | 0 refills | Status: DC | PRN

## 2017-07-15 MED ORDER — FENTANYL CITRATE (PF) 100 MCG/2ML IJ SOLN
INTRAMUSCULAR | Status: AC
  Filled 2017-07-15: qty 2

## 2017-07-15 MED ORDER — MIDAZOLAM HCL 2 MG/2ML IJ SOLN
INTRAMUSCULAR | Status: DC | PRN
  Administered 2017-07-15: 2 mg via INTRAVENOUS

## 2017-07-15 MED ORDER — ROCURONIUM BROMIDE 100 MG/10ML IV SOLN
INTRAVENOUS | Status: DC | PRN
  Administered 2017-07-15: 30 mg via INTRAVENOUS
  Administered 2017-07-15: 20 mg via INTRAVENOUS

## 2017-07-15 MED ORDER — SUGAMMADEX SODIUM 200 MG/2ML IV SOLN
INTRAVENOUS | Status: DC | PRN
  Administered 2017-07-15: 150 mg via INTRAVENOUS

## 2017-07-15 MED ORDER — LIDOCAINE HCL (PF) 2 % IJ SOLN
INTRAMUSCULAR | Status: AC
  Filled 2017-07-15: qty 10

## 2017-07-15 MED ORDER — PHENYLEPHRINE HCL 10 MG/ML IJ SOLN
INTRAMUSCULAR | Status: DC | PRN
Start: 2017-07-15 — End: 2017-07-15
  Administered 2017-07-15: 100 ug via INTRAVENOUS

## 2017-07-15 MED ORDER — MIDAZOLAM HCL 2 MG/2ML IJ SOLN
INTRAMUSCULAR | Status: AC
  Filled 2017-07-15: qty 2

## 2017-07-15 MED ORDER — FAMOTIDINE 20 MG PO TABS
ORAL_TABLET | ORAL | Status: AC
  Administered 2017-07-15: 20 mg via ORAL
  Filled 2017-07-15: qty 1

## 2017-07-15 MED ORDER — FENTANYL CITRATE (PF) 100 MCG/2ML IJ SOLN
INTRAMUSCULAR | Status: AC
  Administered 2017-07-15: 25 ug via INTRAVENOUS
  Filled 2017-07-15: qty 2

## 2017-07-15 MED ORDER — SERTRALINE HCL 50 MG PO TABS: 50.0000 mg | ORAL_TABLET | Freq: Every day | ORAL | 6 refills | Status: DC

## 2017-07-15 MED ORDER — LACTATED RINGERS IV SOLN
INTRAVENOUS | Status: DC
  Administered 2017-07-15: 16:00:00 via INTRAVENOUS

## 2017-07-15 MED ORDER — PROPOFOL 10 MG/ML IV BOLUS
INTRAVENOUS | Status: AC
  Filled 2017-07-15: qty 20

## 2017-07-15 MED ORDER — OXYCODONE-ACETAMINOPHEN 5-325 MG PO TABS
ORAL_TABLET | ORAL | Status: AC
  Administered 2017-07-15: 1
  Filled 2017-07-15: qty 1

## 2017-07-15 MED ORDER — ROCURONIUM BROMIDE 50 MG/5ML IV SOLN
INTRAVENOUS | Status: AC
  Filled 2017-07-15: qty 1

## 2017-07-15 MED ORDER — PROMETHAZINE HCL 25 MG/ML IJ SOLN: 6.2500 mg | INTRAMUSCULAR | Status: DC | PRN

## 2017-07-15 MED ORDER — EPHEDRINE SULFATE 50 MG/ML IJ SOLN
INTRAMUSCULAR | Status: DC | PRN
  Administered 2017-07-15 (×2): 10 mg via INTRAVENOUS

## 2017-07-15 MED ORDER — FENTANYL CITRATE (PF) 100 MCG/2ML IJ SOLN
INTRAMUSCULAR | Status: DC | PRN
  Administered 2017-07-15 (×2): 50 ug via INTRAVENOUS

## 2017-07-15 MED ORDER — FENTANYL CITRATE (PF) 100 MCG/2ML IJ SOLN
25.0000 ug | INTRAMUSCULAR | Status: DC | PRN
  Administered 2017-07-15 (×4): 25 ug via INTRAVENOUS

## 2017-07-15 SURGICAL SUPPLY — 22 items
BLADE SURG SZ11 CARB STEEL (BLADE) ×4 IMPLANT
CATH ROBINSON RED A/P 16FR (CATHETERS) ×4 IMPLANT
CHLORAPREP W/TINT 26ML (MISCELLANEOUS) ×4 IMPLANT
DERMABOND ADVANCED (GAUZE/BANDAGES/DRESSINGS) ×2
DERMABOND ADVANCED .7 DNX12 (GAUZE/BANDAGES/DRESSINGS) ×2 IMPLANT
GLOVE BIO SURGEON STRL SZ 6.5 (GLOVE) ×3 IMPLANT
GLOVE BIO SURGEONS STRL SZ 6.5 (GLOVE) ×1
GLOVE INDICATOR 7.0 STRL GRN (GLOVE) ×4 IMPLANT
GOWN STRL REUS W/ TWL LRG LVL3 (GOWN DISPOSABLE) ×4 IMPLANT
GOWN STRL REUS W/TWL LRG LVL3 (GOWN DISPOSABLE) ×4
KIT RM TURNOVER CYSTO AR (KITS) ×4 IMPLANT
LABEL OR SOLS (LABEL) ×4 IMPLANT
NS IRRIG 500ML POUR BTL (IV SOLUTION) ×4 IMPLANT
PACK GYN LAPAROSCOPIC (MISCELLANEOUS) ×4 IMPLANT
PAD PREP 24X41 OB/GYN DISP (PERSONAL CARE ITEMS) ×4 IMPLANT
SHEARS ENDO 5MM LNG  ASK BEFOR (MISCELLANEOUS) ×2
SHEARS ENDO 5MM LNG ASK BEFOR (MISCELLANEOUS) ×2 IMPLANT
SHEARS HARMONIC ACE PLUS 36CM (ENDOMECHANICALS) ×4 IMPLANT
SUT VIC AB 0 CT2 27 (SUTURE) ×4 IMPLANT
SYR 30ML LL (SYRINGE) ×4 IMPLANT
TROCAR ENDO BLADELESS 11MM (ENDOMECHANICALS) ×4 IMPLANT
TUBING INSUFFLATION (TUBING) ×4 IMPLANT

## 2017-07-15 NOTE — Telephone Encounter (Signed)
mychart message sent- unable to contact by phone.

## 2017-07-15 NOTE — Op Note (Signed)
Procedure(s): LAPAROSCOPIC BILATERAL TUBAL LIGATION, EXCISION AND FULGURATION OF ENDOMETRIOSIS,  DILATATION AND CURETTAGE Procedure Note  Sara Webb female [id number] y.o. 07/15/2017  Indications: The patient is a 26 y.o. [phone] female with multiparity desiring permanent sterilization, h/o endometriosis, dysfunctional uterine bleeding  Pre-operative Diagnosis:  Multiparity desiring permanent sterilization, h/o endometriosis, dysfunctional uterine bleeding   Post-operative Diagnosis: Same  Surgeon: Hildred LaserAnika Cordaro Mukai, MD  Assistants: Sharon SellerMartin DeFrancesco, MD  Anesthesia: General endotracheal anesthesia  Findings: The uterus was sounded to 8.5 cm Fallopian tubes and ovaries appeared normal.  White fibrotic lesions and tan/purple powder burn lesions along the left posterior fossa, left uterosacral ligament, and the posterior cul-de sac.  Normal upper abdomen  Procedure Details: The patient was seen in the Holding Room. The risks, benefits, complications, treatment options, and expected outcomes were discussed with the patient.  The patient concurred with the proposed plan, giving informed consent.  The site of surgery properly noted/marked. The patient was taken to the Operating Room, identified as Sara Webb and the procedure verified as Procedure(s) (LRB): LAPAROSCOPIC BILATERAL TUBAL LIGATION (Bilateral), DILATATION AND CURETTAGE (N/A).  She was then placed under general anesthesia without difficulty. She was placed in the dorsal lithotomy position, and was prepped and draped in a sterile manner.  A timeout was performed. A straight catheterization was performed. A sterile speculum was inserted into the vagina and the cervix was grasped at the anterior lip using a single-toothed tenaculum.  The uterus was sounded to 8.5 cm, and a Hulka clamp was placed for uterine manipulation.  The speculum and tenaculum were then removed.   Attention was turned to the abdomen where an umbilical incision  was made with the scalpel.  The Optiview 5-mm trocar and sleeve were then advanced without difficulty with the laparoscope under direct visualization into the abdomen.  The abdomen was then insufflated with carbon dioxide gas and adequate pneumoperitoneum was obtained. A 5-mm left lower quadrant port and an 5-mm right lower quadrant port were then placed under direct visualization.  A survey of the patient's pelvis and abdomen revealed the findings as above.  The fallopian tubes were then grasped and transected from the uterine attachments and the underlying mesosalpinx with the Harmonic device allowing for bilateral salpingectomy.  The fallopian tubes were then removed from the abdomen under direct visualization.  The operative site was surveyed, and it was found to be hemostatic.   No intraoperative injury to other surrounding organs was noted.    Next biopsy forceps were used to excise several of the implants and lesions noted to be due to endometriosis.  Biopsies from the posterior cul-de-sac, left and right ovarian fossa, and left uterosacral ligaments were taken.  These areas were the fulgurated using the Kleppinger device. Good hemostasis was noted from all biopsy sites.  The abdomen was desufflated and all instruments were then removed from the patient's abdomen.  All skin incisions were closed with 4-0 Vicryl  In an interrupted fashion and Dermabond.    The uterine manipulator was removed from the cervix without complications.  The speculum was again inserted into the vagina and the cervix was again visualized.  A single-toothed tenaculum was used to grasp the anterior lip of the cervix.  A sharp curette was then passed into the uterus and endometrial sampling was collected for pathology.  The speculum and the tenaculum were then removed from the vagina.   The patient tolerated the procedure well.  Sponge, lap, and needle counts were correct times two.  The patient was then taken to the recovery  room awake, extubated and in stable condition.   Estimated Blood Loss:  10 ml      Drains: straight catheterization prior to procedure with  20 ml of clear urine         Total IV Fluids: 700 ml  Specimens: Segments of bilateral fallopian tubes, biopsies of left and right ovarian fossa, posterior cul-de-sac, and left uterosacral ligament.          Implants: None         Complications:  None; patient tolerated the procedure well.         Disposition: PACU - hemodynamically stable.         Condition: stable   Hildred LaserAnika Kinsey Karch, MD [facility]

## 2017-07-15 NOTE — Discharge Instructions (Signed)
General Gynecological Post-Operative Instructions °You may expect to feel dizzy, weak, and drowsy for as long as 24 hours after receiving the medicine that made you sleep (anesthetic).  °Do not drive a car, ride a bicycle, participate in physical activities, or take public transportation until you are done taking narcotic pain medicines or as directed by your doctor.  °Do not drink alcohol or take tranquilizers.  °Do not take medicine that has not been prescribed by your doctor.  °Do not sign important papers or make important decisions while on narcotic pain medicines.  °Have a responsible person with you.  °CARE OF INCISION  °Keep incision clean and dry. °Take showers instead of baths until your doctor gives you permission to take baths.  °Avoid heavy lifting (more than 10 pounds/4.5 kilograms), pushing, or pulling.  °Avoid activities that may risk injury to your surgical site.  °No sexual intercourse or placement of anything in the vagina for 2 weeks or as instructed by your doctor. °If you have tubes coming from the wound site, check with your doctor regarding appropriate care of the tubes. °Only take prescription or over-the-counter medicines  for pain, discomfort, or fever as directed by your doctor. Do not take aspirin. It can make you bleed. Take medicines (antibiotics) that kill germs if they are prescribed for you.  °Call the office or go to the MAU if:  °You feel sick to your stomach (nauseous).  °You start to throw up (vomit).  °You have trouble eating or drinking.  °You have an oral temperature above 101.  °You have constipation that is not helped by adjusting diet or increasing fluid intake. Pain medicines are a common cause of constipation.  °You have any other concerns. °SEEK IMMEDIATE MEDICAL CARE IF:  °You have persistent dizziness.  °You have difficulty breathing or a congested sounding (croupy) cough.  °You have an oral temperature above 102.5, not controlled by medicine.  °There is increasing  pain or tenderness near or in the surgical site.  ° °AMBULATORY SURGERY  °DISCHARGE INSTRUCTIONS ° ° °1) The drugs that you were given will stay in your system until tomorrow so for the next 24 hours you should not: ° °A) Drive an automobile °B) Make any legal decisions °C) Drink any alcoholic beverage ° ° °2) You may resume regular meals tomorrow.  Today it is better to start with liquids and gradually work up to solid foods. ° °You may eat anything you prefer, but it is better to start with liquids, then soup and crackers, and gradually work up to solid foods. ° ° °3) Please notify your doctor immediately if you have any unusual bleeding, trouble breathing, redness and pain at the surgery site, drainage, fever, or pain not relieved by medication. ° ° ° °4) Additional Instructions: ° ° ° ° ° ° ° °Please contact your physician with any problems or Same Day Surgery at [phone], Monday through Friday 6 am to 4 pm, or [place] [facility] [place] Main number at [phone]. °

## 2017-07-15 NOTE — Telephone Encounter (Signed)
Patient called and stated that she needs a refill of her medication the patient also stated that her pharmacy informed her that she does not have anymore refills. The patient would like for a nurse to call her back in regards to her medication concerns. Please advise.

## 2017-07-15 NOTE — Anesthesia Preprocedure Evaluation (Signed)
Anesthesia Evaluation  Patient identified by MRN, date of birth, ID band Patient awake    Reviewed: Allergy & Precautions, H&P , NPO status , Patient's Chart, lab work & pertinent test results, reviewed documented beta blocker date and time   History of Anesthesia Complications (+) Family history of anesthesia reaction and history of anesthetic complications (aunt may have pseudocholinesterase deficiency)  Airway Mallampati: I  TM Distance: >3 FB Neck ROM: full    Dental  (+) Caps, Dental Advidsory Given   Pulmonary neg pulmonary ROS,           Cardiovascular Exercise Tolerance: Good negative cardio ROS       Neuro/Psych  Headaches, neg Seizures negative psych ROS   GI/Hepatic negative GI ROS, Neg liver ROS,   Endo/Other  negative endocrine ROS  Renal/GU negative Renal ROS  negative genitourinary   Musculoskeletal   Abdominal   Peds  Hematology negative hematology ROS (+)   Anesthesia Other Findings Past Medical History: No date: Family history of adverse reaction to anesthesia     Comment:  aunt No date: migraine   Reproductive/Obstetrics negative OB ROS                             Anesthesia Physical Anesthesia Plan  ASA: I  Anesthesia Plan: General   Post-op Pain Management:    Induction: Intravenous  PONV Risk Score and Plan: 3 and Ondansetron and Dexamethasone  Airway Management Planned: Oral ETT  Additional Equipment:   Intra-op Plan:   Post-operative Plan: Extubation in OR  Informed Consent: I have reviewed the patients History and Physical, chart, labs and discussed the procedure including the risks, benefits and alternatives for the proposed anesthesia with the patient or authorized representative who has indicated his/her understanding and acceptance.   Dental Advisory Given  Plan Discussed with: Anesthesiologist, CRNA and Surgeon  Anesthesia Plan Comments:          Anesthesia Quick Evaluation

## 2017-07-15 NOTE — H&P (Signed)
UPDATE TO PREVIOUS HISTORY AND PHYSICAL  The patient has been seen and examined.  Her H&P has been reviewed and the following changes are noted:   Patient also still noting persistent vaginal bleeding despite OCP use after birth of her child 3 months ago.  We had previously discussed in office about performing a D&C at the time of her tubal ligation but at the time patient was unsure if she wanted this done.  Today asks if a D&C can now be performed.  Discussed risks/benefits.  Consent revised with patient's signature noted.  Scheduled procedure now Laparoscopic bilateral salpingectomy with D&C.    Patient also notes that she desires a refill on her Zoloft.  Took her last pill today.  Sent a refill request to the office but states that it has not yet been sent to her pharmacy.  Will send refill today.   Hildred Laserherry, Keyasha Miah, MD 07/15/2017 3:26 PM

## 2017-07-15 NOTE — Transfer of Care (Signed)
Immediate Anesthesia Transfer of Care Note  Patient: Sara Webb  Procedure(s) Performed: Procedure(s): LAPAROSCOPIC BILATERAL TUBAL LIGATION (Bilateral) DILATATION AND CURETTAGE (N/A)  Patient Location: PACU  Anesthesia Type:General  Level of Consciousness: sedated  Airway & Oxygen Therapy: Patient Spontanous Breathing and Patient connected to face mask oxygen  Post-op Assessment: Report given to RN and Post -op Vital signs reviewed and stable  Post vital signs: Reviewed and stable  Last Vitals:  Vitals:   07/15/17 1312 07/15/17 1727  BP: 103/85 124/84  Pulse: 75 92  Resp: 20 15  Temp: (!) 36.1 C 36.4 C  SpO2: 100% 100%    Complications: No apparent anesthesia complications

## 2017-07-15 NOTE — Anesthesia Postprocedure Evaluation (Signed)
Anesthesia Post Note  Patient: Sara Webb  Procedure(s) Performed: LAPAROSCOPIC BILATERAL TUBAL LIGATION (Bilateral ) DILATATION AND CURETTAGE (N/A Vagina )  Patient location during evaluation: PACU Anesthesia Type: General Level of consciousness: awake and alert Pain management: pain level controlled Vital Signs Assessment: post-procedure vital signs reviewed and stable Respiratory status: spontaneous breathing and respiratory function stable Cardiovascular status: stable Anesthetic complications: no     Last Vitals:  Vitals:   07/15/17 1727 07/15/17 1742  BP: 124/84 127/82  Pulse: 92 85  Resp: 15 13  Temp: 36.4 C   SpO2: 100% 100%    Last Pain:  Vitals:   07/15/17 1742  TempSrc:   PainSc: 4                  Lamorris Knoblock K

## 2017-07-15 NOTE — Anesthesia Post-op Follow-up Note (Signed)
Anesthesia QCDR form completed.        

## 2017-07-15 NOTE — Anesthesia Procedure Notes (Signed)

## 2017-07-16 ENCOUNTER — Other Ambulatory Visit: Payer: Self-pay

## 2017-07-16 ENCOUNTER — Encounter: Payer: Self-pay | Admitting: Obstetrics and Gynecology

## 2017-07-16 MED ORDER — SERTRALINE HCL 50 MG PO TABS: 50.0000 mg | ORAL_TABLET | Freq: Every day | ORAL | 1 refills | Status: DC

## 2017-07-17 ENCOUNTER — Encounter: Payer: Self-pay | Admitting: Emergency Medicine

## 2017-07-17 ENCOUNTER — Emergency Department
Admission: EM | Admit: 2017-07-17 | Discharge: 2017-07-17 | Disposition: A | Discharge status: Home / Self Care | Payer: 59 | Attending: Emergency Medicine | Admitting: Emergency Medicine

## 2017-07-17 ENCOUNTER — Encounter: Payer: Self-pay | Admitting: Obstetrics and Gynecology

## 2017-07-17 DIAGNOSIS — N390 Urinary tract infection, site not specified: Secondary | ICD-10-CM | POA: Insufficient documentation

## 2017-07-17 DIAGNOSIS — Z79899 Other long term (current) drug therapy: Secondary | ICD-10-CM | POA: Diagnosis not present

## 2017-07-17 DIAGNOSIS — R319 Hematuria, unspecified: Secondary | ICD-10-CM

## 2017-07-17 DIAGNOSIS — R300 Dysuria: Secondary | ICD-10-CM | POA: Diagnosis present

## 2017-07-17 LAB — URINALYSIS, COMPLETE (UACMP) WITH MICROSCOPIC
Bilirubin Urine: NEGATIVE
Glucose, UA: NEGATIVE mg/dL
Ketones, ur: NEGATIVE mg/dL
Nitrite: POSITIVE — AB
PROTEIN: 100 mg/dL — AB
Specific Gravity, Urine: 1.015 (ref 1.005–1.030)
pH: 6 (ref 5.0–8.0)

## 2017-07-17 LAB — CBC
HEMATOCRIT: 38.6 % (ref 35.0–47.0)
Hemoglobin: 12.9 g/dL (ref 12.0–16.0)
MCH: 30.3 pg (ref 26.0–34.0)
MCHC: 33.4 g/dL (ref 32.0–36.0)
MCV: 90.8 fL (ref 80.0–100.0)
Platelets: 275 10*3/uL (ref 150–440)
RBC: 4.25 MIL/uL (ref 3.80–5.20)
RDW: 13.3 % (ref 11.5–14.5)
WBC: 11.8 10*3/uL — AB (ref 3.6–11.0)

## 2017-07-17 LAB — BASIC METABOLIC PANEL
ANION GAP: 7 (ref 5–15)
BUN: 14 mg/dL (ref 6–20)
CO2: 26 mmol/L (ref 22–32)
Calcium: 9 mg/dL (ref 8.9–10.3)
Chloride: 103 mmol/L (ref 101–111)
Creatinine, Ser: 0.99 mg/dL (ref 0.44–1.00)
Glucose, Bld: 96 mg/dL (ref 65–99)
POTASSIUM: 3.5 mmol/L (ref 3.5–5.1)
SODIUM: 136 mmol/L (ref 135–145)

## 2017-07-17 MED ORDER — CEPHALEXIN 500 MG PO CAPS: 500.0000 mg | ORAL_CAPSULE | Freq: Three times a day (TID) | ORAL | 0 refills | Status: AC

## 2017-07-17 MED ORDER — CEPHALEXIN 500 MG PO CAPS
500.0000 mg | ORAL_CAPSULE | Freq: Once | ORAL | Status: AC
  Administered 2017-07-17: 500 mg via ORAL

## 2017-07-17 MED ORDER — CEPHALEXIN 500 MG PO CAPS
ORAL_CAPSULE | ORAL | Status: AC
  Filled 2017-07-17: qty 1

## 2017-07-17 MED ORDER — PHENAZOPYRIDINE HCL 200 MG PO TABS: 200.0000 mg | ORAL_TABLET | Freq: Three times a day (TID) | ORAL | 0 refills | Status: DC | PRN

## 2017-07-17 MED ORDER — ONDANSETRON HCL 4 MG PO TABS: 4.0000 mg | ORAL_TABLET | Freq: Three times a day (TID) | ORAL | 0 refills | Status: DC | PRN

## 2017-07-17 NOTE — ED Notes (Signed)
ED Provider at bedside. 

## 2017-07-17 NOTE — ED Provider Notes (Addendum)
[facility] Emergency Department Provider Note  ____________________________________________   I have reviewed the triage vital signs and the nursing notes.   HISTORY  Chief Complaint Post-op Problem    HPI Sara Webb is a 26 y.o. female presents today complaining of dysuria, burning with urination, hematuria.  Patient is POD 2 from a D&C and a BTL.  Patient tolerated the procedure well she has had minimal vaginal bleeding.  However, today she began having dysuria and subjective fevers.  She denies vomiting but has had mild nausea.  She states her incision sites are somewhat tender but not in any way progressively worse and feel better than they did the day she went home.  She has been eating and drinking well more drinking than eating.  She is not breast-feeding.  Location dysuria Radiation none Quality see HPI Duration see HPI Timing see HPI Severity mild to moderate Associated sxs see HPI PriorTreatment none   Past Medical History:  Diagnosis Date  . Family history of adverse reaction to anesthesia    aunt  . migraine     Patient Active Problem List   Diagnosis Date Noted  . Endometriosis 07/15/2017  . Postpartum anemia 04/10/2017  . Right ovarian cyst 05/18/2016    Past Surgical History:  Procedure Laterality Date  . DILATION AND CURETTAGE OF UTERUS N/A 07/15/2017   Procedure: DILATATION AND CURETTAGE;  Surgeon: Hildred Laserherry, Anika, MD;  Location: [facility] ORS;  Service: Gynecology;  Laterality: N/A;  . LAPAROSCOPIC OVARIAN CYSTECTOMY Right 05/18/2016   Procedure: LAPAROSCOPIC OVARIAN CYSTECTOMY;  Surgeon: Nadara Mustardobert P Harris, MD;  Location: [facility] ORS;  Service: Gynecology;  Laterality: Right;  . LAPAROSCOPIC TUBAL LIGATION Bilateral 07/15/2017   Procedure: LAPAROSCOPIC BILATERAL TUBAL LIGATION;  Surgeon: Hildred Laserherry, Anika, MD;  Location: [facility] ORS;  Service: Gynecology;  Laterality: Bilateral;  . NO PAST SURGERIES      Prior to Admission medications    Medication Sig Start Date End Date Taking? Authorizing Provider  acetaminophen (TYLENOL) 325 MG tablet Take 325-650 mg every 6 (six) hours as needed by mouth for moderate pain or headache.    [provider]  ibuprofen (ADVIL,MOTRIN) 800 MG tablet Take 1 tablet (800 mg total) by mouth every 8 (eight) hours as needed. 07/15/17   Hildred Laserherry, Anika, MD  Multiple Vitamin (MULTI-VITAMINS) TABS Take 1 tablet daily by mouth.     [provider]  oxyCODONE-acetaminophen (PERCOCET/ROXICET) 5-325 MG tablet Take 1-2 tablets by mouth every 6 (six) hours as needed. 07/15/17   Hildred Laserherry, Anika, MD  sertraline (ZOLOFT) 50 MG tablet Take 1 tablet (50 mg total) by mouth daily. 07/16/17   Hildred Laserherry, Anika, MD    Allergies Diphenhydramine hcl and Sulfa antibiotics  Family History  Problem Relation Age of Onset  . Cancer Paternal Aunt        melanoma    Social History Social History   Tobacco Use  . Smoking status: Never Smoker  . Smokeless tobacco: Never Used  Substance Use Topics  . Alcohol use: No  . Drug use: No    Review of Systems Constitutional: Subjective fevers noted Eyes: No visual changes. ENT: No sore throat. No stiff neck no neck pain Cardiovascular: Denies chest pain. Respiratory: Denies shortness of breath. Gastrointestinal:   no vomiting.  No diarrhea.  No constipation.  Positive nausea Genitourinary: Positive for dysuria. Musculoskeletal: Negative lower extremity swelling Skin: Negative for rash. Neurological: Negative for severe headaches, focal weakness or numbness.   ____________________________________________   PHYSICAL EXAM:  VITAL  SIGNS: ED Triage Vitals  Enc Vitals Group     BP 07/17/17 1800 112/73     Pulse Rate 07/17/17 1800 81     Resp 07/17/17 1800 18     Temp 07/17/17 1800 98.7 F (37.1 C)     Temp Source 07/17/17 1800 Oral     SpO2 07/17/17 1800 97 %     Weight 07/17/17 1800 145 lb (65.8 kg)     Height 07/17/17 1800 5\' 7"  (1.702 m)      Head Circumference --      Peak Flow --      Pain Score 07/17/17 1758 5     Pain Loc --      Pain Edu? --      Excl. in GC? --     Constitutional: Alert and oriented. Well appearing and in no acute distress. Eyes: Conjunctivae are normal Head: Atraumatic HEENT: No congestion/rhinnorhea. Mucous membranes are moist.  Oropharynx non-erythematous Neck:   Nontender with no meningismus, no masses, no stridor Cardiovascular: Normal rate, regular rhythm. Grossly normal heart sounds.  Good peripheral circulation. Respiratory: Normal respiratory effort.  No retractions. Lungs CTAB. Abdominal: Soft and slight suprapubic discomfort consistent with postoperative day, suture sites are clean dry and intact with no evidence of infection. No distention. No guarding no rebound Back:  There is no focal tenderness or step off.  there is no midline tenderness there are no lesions noted. there is no CVA tenderness  Musculoskeletal: No lower extremity tenderness, no upper extremity tenderness. No joint effusions, no DVT signs strong distal pulses no edema Neurologic:  Normal speech and language. No gross focal neurologic deficits are appreciated.  Skin:  Skin is warm, dry and intact. No rash noted. Psychiatric: Mood and affect are normal. Speech and behavior are normal.  ____________________________________________   LABS (all labs ordered are listed, but only abnormal results are displayed)  Labs Reviewed  CBC - Abnormal; Notable for the following components:      Result Value   WBC 11.8 (*)    All other components within normal limits  BASIC METABOLIC PANEL  URINALYSIS, COMPLETE (UACMP) WITH MICROSCOPIC  POC URINE PREG, ED    Pertinent labs  results that were available during my care of the patient were reviewed by me and considered in my medical decision making (see chart for details). ____________________________________________  EKG  I personally interpreted any EKGs ordered by me or  triage  ____________________________________________  RADIOLOGY  Pertinent labs & imaging results that were available during my care of the patient were reviewed by me and considered in my medical decision making (see chart for details). If possible, patient and/or family made aware of any abnormal findings.  No results found. ____________________________________________    PROCEDURES  Procedure(s) performed: None  Procedures  Critical Care performed: None  ____________________________________________   INITIAL IMPRESSION / ASSESSMENT AND PLAN / ED COURSE  Pertinent labs & imaging results that were available during my care of the patient were reviewed by me and considered in my medical decision making (see chart for details).  Patient here with dysuria and symptoms of urinary tract infection after a procedure.  We will await urinalysis white counts normal exam is reassuring abdomen nonsurgical  ----------------------------------------- 10:13 PM on 07/17/2017 ----------------------------------------- Discussed with Dr. Greggory Keen of her OB/GYN service who agrees with management and will follow-up tomorrow     ____________________________________________   FINAL CLINICAL IMPRESSION(S) / ED DIAGNOSES  Final diagnoses:  None  This chart was dictated using voice recognition software.  Despite best efforts to proofread,  errors can occur which can change meaning.      Jeanmarie PlantMcShane, Ersel Enslin A, MD 07/17/17 1856    Jeanmarie PlantMcShane, [place] A, MD 07/17/17 2213    Jeanmarie PlantMcShane, Diasha Castleman A, MD 07/17/17 2213

## 2017-07-17 NOTE — ED Triage Notes (Signed)
Patient presents to the ED with headache, chills, fever, dysuria, and hematuria that all began this afternoon.  Patient states she had a D&C and tubal ligation on Monday.  Patient is alert and oriented x 4.  Patient is complaining of some pelvic pain, denies upper abdominal pain.  Patient denies vomiting and diarrhea.

## 2017-07-18 ENCOUNTER — Ambulatory Visit: Payer: 59 | Admitting: Obstetrics and Gynecology

## 2017-07-18 ENCOUNTER — Encounter: Payer: Self-pay | Admitting: Obstetrics and Gynecology

## 2017-07-18 VITALS — BP 117/76 | HR 67 | Temp 97.6°F | Ht 67.0 in | Wt 151.0 lb

## 2017-07-18 DIAGNOSIS — N3001 Acute cystitis with hematuria: Secondary | ICD-10-CM

## 2017-07-18 LAB — SURGICAL PATHOLOGY

## 2017-07-18 MED ORDER — NITROFURANTOIN MONOHYD MACRO 100 MG PO CAPS: 100.0000 mg | ORAL_CAPSULE | Freq: Two times a day (BID) | ORAL | 0 refills | Status: DC

## 2017-07-18 NOTE — Progress Notes (Signed)
HPI:      Sara Webb is a 26 y.o. [phone] who LMP was No LMP recorded.  Subjective:   She presents today 3 days after surgery.  She was seen in the emergency department yesterday for pelvic pressure and pain especially with urination and burning with urination.  She reports generally feeling rundown and achy.  She denies fever. A UA performed yesterday in the emergency department was significant for likely urinary tract infection.  She was begun on Keflex.  She states there has been absolutely no change from yesterday.  She is also taking Pyridium for the pain.    Hx: The following portions of the patient's history were reviewed and updated as appropriate:             She  has a past medical history of Family history of adverse reaction to anesthesia and migraine. She does not have any pertinent problems on file. She  has a past surgical history that includes No past surgeries; Laparoscopic ovarian cystectomy (Right, 05/18/2016); Laparoscopic tubal ligation (Bilateral, 07/15/2017); and Dilation and curettage of uterus (N/A, 07/15/2017). Her family history includes Cancer in her paternal aunt. She  reports that  has never smoked. she has never used smokeless tobacco. She reports that she does not drink alcohol or use drugs. She is allergic to diphenhydramine hcl and sulfa antibiotics.       Review of Systems:  Review of Systems  Constitutional: Denied constitutional symptoms, night sweats, recent illness, fatigue, fever, insomnia and weight loss.  Eyes: Denied eye symptoms, eye pain, photophobia, vision change and visual disturbance.  Ears/Nose/Throat/Neck: Denied ear, nose, throat or neck symptoms, hearing loss, nasal discharge, sinus congestion and sore throat.  Cardiovascular: Denied cardiovascular symptoms, arrhythmia, chest pain/pressure, edema, exercise intolerance, orthopnea and palpitations.  Respiratory: Denied pulmonary symptoms, asthma, pleuritic pain, productive sputum,  cough, dyspnea and wheezing.  Gastrointestinal: Denied, gastro-esophageal reflux, melena, nausea and vomiting.  Genitourinary: See HPI for additional information.  Musculoskeletal: Denied musculoskeletal symptoms, stiffness, swelling, muscle weakness and myalgia.  Dermatologic: Denied dermatology symptoms, rash and scar.  Neurologic: Denied neurology symptoms, dizziness, headache, neck pain and syncope.  Psychiatric: Denied psychiatric symptoms, anxiety and depression.  Endocrine: Denied endocrine symptoms including hot flashes and night sweats.   Meds:   Current Outpatient Medications on File Prior to Visit  Medication Sig Dispense Refill  . acetaminophen (TYLENOL) 325 MG tablet Take 325-650 mg every 6 (six) hours as needed by mouth for moderate pain or headache.    . cephALEXin (KEFLEX) 500 MG capsule Take 1 capsule (500 mg total) by mouth 3 (three) times daily for 10 days. 30 capsule 0  . ibuprofen (ADVIL,MOTRIN) 800 MG tablet Take 1 tablet (800 mg total) by mouth every 8 (eight) hours as needed. 30 tablet 1  . Multiple Vitamin (MULTI-VITAMINS) TABS Take 1 tablet daily by mouth.     . ondansetron (ZOFRAN) 4 MG tablet Take 1 tablet (4 mg total) by mouth every 8 (eight) hours as needed for nausea or vomiting. 8 tablet 0  . phenazopyridine (PYRIDIUM) 200 MG tablet Take 1 tablet (200 mg total) by mouth 3 (three) times daily as needed for pain. 20 tablet 0  . sertraline (ZOLOFT) 50 MG tablet Take 1 tablet (50 mg total) by mouth daily. 90 tablet 1   No current facility-administered medications on file prior to visit.     Objective:     Vitals:   07/18/17 1342  BP: 117/76  Pulse: 67  Temp:  97.6 F (36.4 C)               Abdomen: Soft.  Non-tender.  No masses.  No HSM.  Incision/s: Intact.  Healing well.  No erythema.  No drainage.      Assessment:    [id number] Patient Active Problem List   Diagnosis Date Noted  . Endometriosis 07/15/2017  . Postpartum anemia 04/10/2017  .  Right ovarian cyst 05/18/2016     1. Acute cystitis with hematuria     The patient notes no improvement with Keflex.  Possibly too early to notice improvement.  We have discussed multiple options and she has chosen to change antibiotics.  I discussed the use of Pyridium in detail.  She will stop the Keflex and begin Macrobid.   Plan:            1.  We will change antibiotics to Macrobid while culture results pending.  2.  Patient to contact the office on Monday if no better. Orders No orders of the defined types were placed in this encounter.    Meds ordered this encounter  Medications  . nitrofurantoin, macrocrystal-monohydrate, (MACROBID) 100 MG capsule    Sig: Take 1 capsule (100 mg total) by mouth 2 (two) times daily.    Dispense:  14 capsule    Refill:  0      F/U  Return for As Scheduled Post-op. I spent 15 minutes with this patient of which greater than 50% was spent discussing urinary tract infection, antibiotic resistance, signs and symptoms of complications.  All of her concerns and questions were addressed and answered.  Elonda Huskyavid J. Samirah Scarpati, M.D. 07/18/2017 2:06 PM

## 2017-07-20 LAB — URINE CULTURE

## 2017-12-24 ENCOUNTER — Other Ambulatory Visit: Payer: Self-pay | Admitting: Obstetrics and Gynecology

## 2018-02-10 ENCOUNTER — Encounter: Payer: Self-pay | Admitting: Certified Nurse Midwife

## 2018-02-10 ENCOUNTER — Ambulatory Visit (INDEPENDENT_AMBULATORY_CARE_PROVIDER_SITE_OTHER): Payer: Managed Care, Other (non HMO) | Admitting: Certified Nurse Midwife

## 2018-02-10 VITALS — BP 82/50 | HR 62 | Ht 67.0 in | Wt 159.3 lb

## 2018-02-10 DIAGNOSIS — N941 Unspecified dyspareunia: Secondary | ICD-10-CM | POA: Diagnosis not present

## 2018-02-10 DIAGNOSIS — N809 Endometriosis, unspecified: Secondary | ICD-10-CM | POA: Diagnosis not present

## 2018-02-10 MED ORDER — NORETHINDRONE 0.35 MG PO TABS: 1.0000 | ORAL_TABLET | Freq: Every day | ORAL | 11 refills | Status: DC

## 2018-02-10 NOTE — Patient Instructions (Addendum)
Leuprolide injection What is this medicine? LEUPROLIDE (loo PROE lide) is a man-made hormone. It is used to treat the symptoms of prostate cancer. This medicine may also be used to treat children with early onset of puberty. It may be used for other hormonal conditions. This medicine may be used for other purposes; ask your health care provider or pharmacist if you have questions. COMMON BRAND NAME(S): Lupron What should I tell my health care provider before I take this medicine? They need to know if you have any of these conditions: -diabetes -heart disease or previous heart attack -high blood pressure -high cholesterol -pain or difficulty passing urine -spinal cord metastasis -stroke -tobacco smoker -an unusual or allergic reaction to leuprolide, benzyl alcohol, other medicines, foods, dyes, or preservatives -pregnant or trying to get pregnant -breast-feeding How should I use this medicine? This medicine is for injection under the skin or into a muscle. You will be taught how to prepare and give this medicine. Use exactly as directed. Take your medicine at regular intervals. Do not take your medicine more often than directed. It is important that you put your used needles and syringes in a special sharps container. Do not put them in a trash can. If you do not have a sharps container, call your pharmacist or healthcare provider to get one. A special MedGuide will be given to you by the pharmacist with each prescription and refill. Be sure to read this information carefully each time. Talk to your pediatrician regarding the use of this medicine in children. While this medicine may be prescribed for children as young as 8 years for selected conditions, precautions do apply. Overdosage: If you think you have taken too much of this medicine contact a poison control center or emergency room at once. NOTE: This medicine is only for you. Do not share this medicine with others. What if I miss a  dose? If you miss a dose, take it as soon as you can. If it is almost time for your next dose, take only that dose. Do not take double or extra doses. What may interact with this medicine? Do not take this medicine with any of the following medications: -chasteberry This medicine may also interact with the following medications: -herbal or dietary supplements, like black cohosh or DHEA -female hormones, like estrogens or progestins and birth control pills, patches, rings, or injections -female hormones, like testosterone This list may not describe all possible interactions. Give your health care provider a list of all the medicines, herbs, non-prescription drugs, or dietary supplements you use. Also tell them if you smoke, drink alcohol, or use illegal drugs. Some items may interact with your medicine. What should I watch for while using this medicine? Visit your doctor or health care professional for regular checks on your progress. During the first week, your symptoms may get worse, but then will improve as you continue your treatment. You may get hot flashes, increased bone pain, increased difficulty passing urine, or an aggravation of nerve symptoms. Discuss these effects with your doctor or health care professional, some of them may improve with continued use of this medicine. Female patients may experience a menstrual cycle or spotting during the first 2 months of therapy with this medicine. If this continues, contact your doctor or health care professional. What side effects may I notice from receiving this medicine? Side effects that you should report to your doctor or health care professional as soon as possible: -allergic reactions like skin rash, itching or   hives, swelling of the face, lips, or tongue -breathing problems -chest pain -depression or memory disorders -pain in your legs or groin -pain at site where injected -severe headache -swelling of the feet and legs -visual  changes -vomiting Side effects that usually do not require medical attention (report to your doctor or health care professional if they continue or are bothersome): -breast swelling or tenderness -decrease in sex drive or performance -diarrhea -hot flashes -loss of appetite -muscle, joint, or bone pains -nausea -redness or irritation at site where injected -skin problems or acne This list may not describe all possible side effects. Call your doctor for medical advice about side effects. You may report side effects to FDA at 1-800-FDA-1088. Where should I keep my medicine? Keep out of the reach of children. Store below 25 degrees C (77 degrees F). Do not freeze. Protect from light. Do not use if it is not clear or if there are particles present. Throw away any unused medicine after the expiration date. NOTE: This sheet is a summary. It may not cover all possible information. If you have questions about this medicine, talk to your doctor, pharmacist, or health care provider.  2018 Elsevier/Gold Standard (2016-03-26 10:54:35) Norethindrone tablets (contraception) What is this medicine? NORETHINDRONE (nor eth IN drone) is an oral contraceptive. The product contains a female hormone known as a progestin. It is used to prevent pregnancy. This medicine may be used for other purposes; ask your health care provider or pharmacist if you have questions. COMMON BRAND NAME(S): Camila, Deblitane 28-Day, Errin, Heather, [place], [place], [place], Nor-QD, Nora-BE, Norlyroc, Ortho Micronor, [employer] 28-Day What should I tell my health care provider before I take this medicine? They need to know if you have any of these conditions: -blood vessel disease or blood clots -breast, cervical, or vaginal cancer -diabetes -heart disease -kidney disease -liver disease -mental depression -migraine -seizures -stroke -vaginal bleeding -an unusual or allergic reaction to norethindrone, other medicines, foods,  dyes, or preservatives -pregnant or trying to get pregnant -breast-feeding How should I use this medicine? Take this medicine by mouth with a glass of water. You may take it with or without food. Follow the directions on the prescription label. Take this medicine at the same time each day and in the order directed on the package. Do not take your medicine more often than directed. Contact your pediatrician regarding the use of this medicine in children. Special care may be needed. This medicine has been used in female children who have started having menstrual periods. A patient package insert for the product will be given with each prescription and refill. Read this sheet carefully each time. The sheet may change frequently. Overdosage: If you think you have taken too much of this medicine contact a poison control center or emergency room at once. NOTE: This medicine is only for you. Do not share this medicine with others. What if I miss a dose? Try not to miss a dose. Every time you miss a dose or take a dose late your chance of pregnancy increases. When 1 pill is missed (even if only 3 hours late), take the missed pill as soon as possible and continue taking a pill each day at the regular time (use a back up method of birth control for the next 48 hours). If more than 1 dose is missed, use an additional birth control method for the rest of your pill pack until menses occurs. Contact your health care professional if more than 1 dose has  been missed. What may interact with this medicine? Do not take this medicine with any of the following medications: -amprenavir or fosamprenavir -bosentan This medicine may also interact with the following medications: -antibiotics or medicines for infections, especially rifampin, rifabutin, rifapentine, and griseofulvin, and possibly penicillins or tetracyclines -aprepitant -barbiturate medicines, such as  phenobarbital -carbamazepine -felbamate -modafinil -oxcarbazepine -phenytoin -ritonavir or other medicines for HIV infection or AIDS -St. John's wort -topiramate This list may not describe all possible interactions. Give your health care provider a list of all the medicines, herbs, non-prescription drugs, or dietary supplements you use. Also tell them if you smoke, drink alcohol, or use illegal drugs. Some items may interact with your medicine. What should I watch for while using this medicine? Visit your doctor or health care professional for regular checks on your progress. You will need a regular breast and pelvic exam and Pap smear while on this medicine. Use an additional method of birth control during the first cycle that you take these tablets. If you have any reason to think you are pregnant, stop taking this medicine right away and contact your doctor or health care professional. If you are taking this medicine for hormone related problems, it may take several cycles of use to see improvement in your condition. This medicine does not protect you against HIV infection (AIDS) or any other sexually transmitted diseases. What side effects may I notice from receiving this medicine? Side effects that you should report to your doctor or health care professional as soon as possible: -breast tenderness or discharge -pain in the abdomen, chest, groin or leg -severe headache -skin rash, itching, or hives -sudden shortness of breath -unusually weak or tired -vision or speech problems -yellowing of skin or eyes Side effects that usually do not require medical attention (report to your doctor or health care professional if they continue or are bothersome): -changes in sexual desire -change in menstrual flow -facial hair growth -fluid retention and swelling -headache -irritability -nausea -weight gain or loss This list may not describe all possible side effects. Call your doctor for  medical advice about side effects. You may report side effects to FDA at 1-800-FDA-1088. Where should I keep my medicine? Keep out of the reach of children. Store at room temperature between 15 and 30 degrees C (59 and 86 degrees F). Throw away any unused medicine after the expiration date. NOTE: This sheet is a summary. It may not cover all possible information. If you have questions about this medicine, talk to your doctor, pharmacist, or health care provider.  2018 Elsevier/Gold Standard (2012-04-25 16:41:35) Endometriosis Endometriosis is a condition in which the tissue that lines the uterus (endometrium) grows outside of its normal location. The tissue may grow in many locations close to the uterus, but it commonly grows on the ovaries, fallopian tubes, vagina, or bowel. When the uterus sheds the endometrium every menstrual cycle, there is bleeding wherever the endometrial tissue is located. This can cause pain because blood is irritating to tissues that are not normally exposed to it. What are the causes? The cause of endometriosis is not known. What increases the risk? You may be more likely to develop endometriosis if you:  Have a family history of endometriosis.  Have never given birth.  Started your period at age 51 or younger.  Have high levels of estrogen in your body.  Were exposed to a certain medicine (diethylstilbestrol) before you were born (in utero).  Had low birth weight.  Were born as  a twin, triplet, or other multiple.  Have a BMI of less than 25. BMI is an estimate of body fat and is calculated from height and weight.  What are the signs or symptoms? Often, there are no symptoms of this condition. If you do have symptoms, they may:  Vary depending on where your endometrial tissue is growing.  Occur during your menstrual period (most common) or midcycle.  Come and go, or you may go months with no symptoms at all.  Stop with menopause.  Symptoms may  include:  Pain in the back or abdomen.  Heavier bleeding during periods.  Pain during sex.  Painful bowel movements.  Infertility.  Pelvic pain.  Bleeding more than once a month.  How is this diagnosed? This condition is diagnosed based on your symptoms and a physical exam. You may have tests, such as:  Blood tests and urine tests. These may be done to help rule out other possible causes of your symptoms.  Ultrasound, to look for abnormal tissues.  An X-ray of the lower bowel (barium enema).  An ultrasound that is done through the vagina (transvaginally).  CT scan.  MRI.  Laparoscopy. In this procedure, a lighted, pencil-sized instrument called a laparoscope is inserted into your abdomen through an incision. The laparoscope allows your health care provider to look at the organs inside your body and check for abnormal tissue to confirm the diagnosis. If abnormal tissue is found, your health care provider may remove a small piece of tissue (biopsy) to be examined under a microscope.  How is this treated? Treatment for this condition may include:  Medicines to relieve pain, such as NSAIDs.  Hormone therapy. This involves using artificial (synthetic) hormones to reduce endometrial tissue growth. Your health care provider may recommend using a hormonal form of birth control, or other medicines.  Surgery. This may be done to remove abnormal endometrial tissue. ? In some cases, tissue may be removed using a laparoscope and a laser (laparoscopic laser treatment). ? In severe cases, surgery may be done to remove the fallopian tubes, uterus, and ovaries (hysterectomy).  Follow these instructions at home:  Take over-the-counter and prescription medicines only as told by your health care provider.  Do not drive or use heavy machinery while taking prescription pain medicine.  Try to avoid activities that cause pain, including sexual activity.  Keep all follow-up visits as told  by your health care provider. This is important. Contact a health care provider if:  You have pain in the area between your hip bones (pelvic area) that occurs: ? Before, during, or after your period. ? In between your period and gets worse during your period. ? During or after sex. ? With bowel movements or urination, especially during your period.  You have problems getting pregnant.  You have a fever. Get help right away if:  You have severe pain that does not get better with medicine.  You have severe nausea and vomiting, or you cannot eat without vomiting.  You have pain that affects only the lower, right side of your abdomen.  You have abdominal pain that gets worse.  You have abdominal swelling.  You have blood in your stool. This information is not intended to replace advice given to you by your health care provider. Make sure you discuss any questions you have with your health care provider. Document Released: 08/03/2000 Document Revised: 05/11/2016 Document Reviewed: 01/07/2016 Elsevier Interactive Patient Education  2018 [employer]. Medroxyprogesterone injection [Contraceptive] What is  this medicine? MEDROXYPROGESTERONE (me DROX ee proe JES te rone) contraceptive injections prevent pregnancy. They provide effective birth control for 3 months. Depo-subQ Provera 104 is also used for treating pain related to endometriosis. This medicine may be used for other purposes; ask your health care provider or pharmacist if you have questions. COMMON BRAND NAME(S): Depo-Provera, Depo-subQ Provera 104 What should I tell my health care provider before I take this medicine? They need to know if you have any of these conditions: -frequently drink alcohol -asthma -blood vessel disease or a history of a blood clot in the lungs or legs -bone disease such as osteoporosis -breast cancer -diabetes -eating disorder (anorexia nervosa or bulimia) -high blood pressure -HIV infection or  AIDS -kidney disease -liver disease -mental depression -migraine -seizures (convulsions) -stroke -tobacco smoker -vaginal bleeding -an unusual or allergic reaction to medroxyprogesterone, other hormones, medicines, foods, dyes, or preservatives -pregnant or trying to get pregnant -breast-feeding How should I use this medicine? Depo-Provera Contraceptive injection is given into a muscle. Depo-subQ Provera 104 injection is given under the skin. These injections are given by a health care professional. You must not be pregnant before getting an injection. The injection is usually given during the first 5 days after the start of a menstrual period or 6 weeks after delivery of a baby. Talk to your pediatrician regarding the use of this medicine in children. Special care may be needed. These injections have been used in female children who have started having menstrual periods. Overdosage: If you think you have taken too much of this medicine contact a poison control center or emergency room at once. NOTE: This medicine is only for you. Do not share this medicine with others. What if I miss a dose? Try not to miss a dose. You must get an injection once every 3 months to maintain birth control. If you cannot keep an appointment, call and reschedule it. If you wait longer than 13 weeks between Depo-Provera contraceptive injections or longer than 14 weeks between Depo-subQ Provera 104 injections, you could get pregnant. Use another method for birth control if you miss your appointment. You may also need a pregnancy test before receiving another injection. What may interact with this medicine? Do not take this medicine with any of the following medications: -bosentan This medicine may also interact with the following medications: -aminoglutethimide -antibiotics or medicines for infections, especially rifampin, rifabutin, rifapentine, and griseofulvin -aprepitant -barbiturate medicines such as  phenobarbital or primidone -bexarotene -carbamazepine -medicines for seizures like ethotoin, felbamate, oxcarbazepine, phenytoin, topiramate -modafinil -St. John's wort This list may not describe all possible interactions. Give your health care provider a list of all the medicines, herbs, non-prescription drugs, or dietary supplements you use. Also tell them if you smoke, drink alcohol, or use illegal drugs. Some items may interact with your medicine. What should I watch for while using this medicine? This drug does not protect you against HIV infection (AIDS) or other sexually transmitted diseases. Use of this product may cause you to lose calcium from your bones. Loss of calcium may cause weak bones (osteoporosis). Only use this product for more than 2 years if other forms of birth control are not right for you. The longer you use this product for birth control the more likely you will be at risk for weak bones. Ask your health care professional how you can keep strong bones. You may have a change in bleeding pattern or irregular periods. Many females stop having periods while taking this drug.  If you have received your injections on time, your chance of being pregnant is very low. If you think you may be pregnant, see your health care professional as soon as possible. Tell your health care professional if you want to get pregnant within the next year. The effect of this medicine may last a long time after you get your last injection. What side effects may I notice from receiving this medicine? Side effects that you should report to your doctor or health care professional as soon as possible: -allergic reactions like skin rash, itching or hives, swelling of the face, lips, or tongue -breast tenderness or discharge -breathing problems -changes in vision -depression -feeling faint or lightheaded, falls -fever -pain in the abdomen, chest, groin, or leg -problems with balance, talking,  walking -unusually weak or tired -yellowing of the eyes or skin Side effects that usually do not require medical attention (report to your doctor or health care professional if they continue or are bothersome): -acne -fluid retention and swelling -headache -irregular periods, spotting, or absent periods -temporary pain, itching, or skin reaction at site where injected -weight gain This list may not describe all possible side effects. Call your doctor for medical advice about side effects. You may report side effects to FDA at 1-800-FDA-1088. Where should I keep my medicine? This does not apply. The injection will be given to you by a health care professional. NOTE: This sheet is a summary. It may not cover all possible information. If you have questions about this medicine, talk to your doctor, pharmacist, or health care provider.  2018 Elsevier/Gold Standard (2008-08-27 18:37:56)

## 2018-02-18 NOTE — Progress Notes (Signed)
GYN ENCOUNTER NOTE  Subjective:       Sara Webb is a 27 y.o. [phone] female is here for gynecologic evaluation of the following issues:  1. Medication check 2. Endometriosis management  Patient is no longer taking Zoloft and feeling better.  Reports lower abdominal pain and radiating lower back pain with menses as well as pain after intercourse since 2017 or 2018 which predated diagnosis of endometriosis. Would like to discuss management options.   Denies difficulty breathing or respiratory distress, chest pain, excessive vaginal bleeding, dysuria, and leg pain or swelling.    Gynecologic History  Patient's last menstrual period was 01/23/2018.  Contraception: tubal ligation   Last Pap: 10/2015. Results were: normal  Obstetric History  OB History  Gravida Para Term Preterm AB Living  4 3 3   1 3   SAB TAB Ectopic Multiple Live Births      1 0 3    # Outcome Date GA Lbr Len/2nd Weight Sex Delivery Anes PTL Lv  4 Term 04/09/17 3258w3d / 00:24 6 lb 13 oz (3.09 kg) M Vag-Spont None  LIV  3 Ectopic 2015        ND  2 Term     M Vag-Spont   LIV  1 Term     M Vag-Spont   LIV    Past Medical History:  Diagnosis Date  . Family history of adverse reaction to anesthesia    aunt  . migraine     Past Surgical History:  Procedure Laterality Date  . DILATION AND CURETTAGE OF UTERUS N/A 07/15/2017   Procedure: DILATATION AND CURETTAGE;  Surgeon: Hildred Laserherry, Anika, MD;  Location: [facility];  Service: Gynecology;  Laterality: N/A;  . LAPAROSCOPIC OVARIAN CYSTECTOMY Right 05/18/2016   Procedure: LAPAROSCOPIC OVARIAN CYSTECTOMY;  Surgeon: Nadara Mustardobert P Harris, MD;  Location: [facility];  Service: Gynecology;  Laterality: Right;  . LAPAROSCOPIC TUBAL LIGATION Bilateral 07/15/2017   Procedure: LAPAROSCOPIC BILATERAL TUBAL LIGATION;  Surgeon: Hildred Laserherry, Anika, MD;  Location: [facility];  Service: Gynecology;  Laterality: Bilateral;  . NO PAST SURGERIES      Current Outpatient Medications on File  Prior to Visit  Medication Sig Dispense Refill  . acetaminophen (TYLENOL) 325 MG tablet Take 325-650 mg every 6 (six) hours as needed by mouth for moderate pain or headache.    . ibuprofen (ADVIL,MOTRIN) 800 MG tablet Take 1 tablet (800 mg total) by mouth every 8 (eight) hours as needed. 30 tablet 1  . Multiple Vitamin (MULTI-VITAMINS) TABS Take 1 tablet daily by mouth.      No current facility-administered medications on file prior to visit.     Allergies  Allergen Reactions  . Diphenhydramine Hcl Other (See Comments)    hyper   . Sulfa Antibiotics Other (See Comments)    Childhood allergy    Social History   Socioeconomic History  . Marital status: Married    Spouse name: Not on file  . Number of children: Not on file  . Years of education: Not on file  . Highest education level: Not on file  Occupational History  . Not on file  Social Needs  . Financial resource strain: Not on file  . Food insecurity:    Worry: Not on file    Inability: Not on file  . Transportation needs:    Medical: Not on file    Non-medical: Not on file  Tobacco Use  . Smoking status: Never Smoker  . Smokeless tobacco: Never Used  Substance  and Sexual Activity  . Alcohol use: No  . Drug use: No  . Sexual activity: Yes    Birth control/protection: None  Lifestyle  . Physical activity:    Days per week: Not on file    Minutes per session: Not on file  . Stress: Not on file  Relationships  . Social connections:    Talks on phone: Not on file    Gets together: Not on file    Attends religious service: Not on file    Active member of club or organization: Not on file    Attends meetings of clubs or organizations: Not on file    Relationship status: Not on file  . Intimate partner violence:    Fear of current or ex partner: Not on file    Emotionally abused: Not on file    Physically abused: Not on file    Forced sexual activity: Not on file  Other Topics Concern  . Not on file  Social  History Narrative  . Not on file    Family History  Problem Relation Age of Onset  . Cancer Paternal Aunt        melanoma    The following portions of the patient's history were reviewed and updated as appropriate: allergies, current medications, past family history, past medical history, past social history, past surgical history and problem list.  Review of Systems  ROS negative except as noted above. Information obtained from patient.   Objective:   BP (!) 82/50   Pulse 62   Ht 5\' 7"  (1.702 m)   Wt 159 lb 4.8 oz (72.3 kg)   LMP 01/23/2018   Breastfeeding? No   BMI 24.95 kg/m    GENERAL: Alert and oriented x 4, no apparent distress.   Physical exam: not indicated.   Depression screen [facility] 2/9 02/10/2018 06/14/2017  Decreased Interest 0 0  Down, Depressed, Hopeless 0 0  PHQ - 2 Score 0 0  Altered sleeping 0 0  Tired, decreased energy 1 0  Change in appetite 0 0  Feeling bad or failure about yourself  0 0  Trouble concentrating 0 0  Moving slowly or fidgety/restless 0 0  Suicidal thoughts 0 -  PHQ-9 Score 1 0  Difficult doing work/chores - Not difficult at all    Assessment:   1. Endometriosis  2. Dyspareunia  Plan:   Discussed options for symptom management including POPs, Mirena, GnRH, and surgical options.   Rx: Camila, see orders.   Reviewed red flag symptoms and when to call.   RTC as needed.    Gunnar Bulla, CNM [facility], [facility]

## 2018-03-19 ENCOUNTER — Other Ambulatory Visit: Payer: Self-pay

## 2018-03-19 MED ORDER — NORETHINDRONE 0.35 MG PO TABS: 1.0000 | ORAL_TABLET | Freq: Every day | ORAL | 4 refills | Status: DC

## 2018-04-08 ENCOUNTER — Other Ambulatory Visit: Payer: Self-pay

## 2019-03-22 ENCOUNTER — Other Ambulatory Visit: Payer: Self-pay | Admitting: Certified Nurse Midwife

## 2019-04-07 ENCOUNTER — Other Ambulatory Visit: Payer: Self-pay | Admitting: Certified Nurse Midwife

## 2019-04-08 ENCOUNTER — Other Ambulatory Visit: Payer: Self-pay

## 2019-04-08 MED ORDER — NORETHINDRONE 0.35 MG PO TABS: 1.0000 | ORAL_TABLET | Freq: Every day | ORAL | 0 refills | Status: DC

## 2019-04-20 DIAGNOSIS — F419 Anxiety disorder, unspecified: Secondary | ICD-10-CM | POA: Insufficient documentation

## 2020-03-14 ENCOUNTER — Encounter: Payer: Self-pay | Admitting: Certified Nurse Midwife

## 2020-03-14 ENCOUNTER — Ambulatory Visit (INDEPENDENT_AMBULATORY_CARE_PROVIDER_SITE_OTHER): Payer: Managed Care, Other (non HMO) | Admitting: Certified Nurse Midwife

## 2020-03-14 VITALS — BP 119/81 | HR 82 | Ht 67.0 in | Wt 157.7 lb

## 2020-03-14 DIAGNOSIS — Z01419 Encounter for gynecological examination (general) (routine) without abnormal findings: Secondary | ICD-10-CM

## 2020-03-14 DIAGNOSIS — Z124 Encounter for screening for malignant neoplasm of cervix: Secondary | ICD-10-CM

## 2020-03-14 NOTE — Progress Notes (Signed)
ANNUAL PREVENTATIVE CARE GYN  ENCOUNTER NOTE  Subjective:       Sara Webb is a 29 y.o. [phone] female here for a routine annual gynecologic exam.  Current complaints: 1.  Needs Pap smear  Plans on discussing anxiety and medication dose with PCP.   Denies difficulty breathing or respiratory distress, chest pain, abdominal pain, excessive vaginal bleeding, dysuria, and leg pain or swelling.    Gynecologic History  Patient's last menstrual period was 03/11/2020. Period Duration (Days): 6-7 Period Pattern: Regular Menstrual Flow: Heavy, Moderate Menstrual Control: Tampon Menstrual Control Change Freq (Hours): 4-5 Dysmenorrhea: (!) Moderate Dysmenorrhea Symptoms: (S) Cramping, Other (Comment), Headache (back pain)  Contraception: tubal ligation    Upstream - 03/14/20 1528      Pregnancy Intention Screening   Does the patient want to become pregnant in the next year? No    Does the patient's partner want to become pregnant in the next year? No    Would the patient like to discuss contraceptive options today? No      Contraception Wrap Up   Current Method Female Sterilization    End Method Female Sterilization    Contraception Counseling Provided Yes          The pregnancy intention screening data noted above was reviewed. Potential methods of contraception were discussed. The patient elected to proceed with Female Sterilization.   Last Pap: 10/2015. Results were: normal  Obstetric History  OB History  Gravida Para Term Preterm AB Living  4 3 3   1 3   SAB TAB Ectopic Multiple Live Births      1 0 3    # Outcome Date GA Lbr Len/2nd Weight Sex Delivery Anes PTL Lv  4 Term 04/09/17 [redacted]w[redacted]d / 00:24 6 lb 13 oz (3.09 kg) M Vag-Spont None  LIV  3 Ectopic 2015        ND  2 Term     M Vag-Spont   LIV  1 Term     M Vag-Spont   LIV    Past Medical History:  Diagnosis Date  . Family history of adverse reaction to anesthesia    aunt  . migraine     Past Surgical  History:  Procedure Laterality Date  . DILATION AND CURETTAGE OF UTERUS N/A 07/15/2017   Procedure: DILATATION AND CURETTAGE;  Surgeon: 07/17/2017, MD;  Location: [facility];  Service: Gynecology;  Laterality: N/A;  . LAPAROSCOPIC OVARIAN CYSTECTOMY Right 05/18/2016   Procedure: LAPAROSCOPIC OVARIAN CYSTECTOMY;  Surgeon: 05/20/2016, MD;  Location: [facility];  Service: Gynecology;  Laterality: Right;  . LAPAROSCOPIC TUBAL LIGATION Bilateral 07/15/2017   Procedure: LAPAROSCOPIC BILATERAL TUBAL LIGATION;  Surgeon: 07/17/2017, MD;  Location: [facility];  Service: Gynecology;  Laterality: Bilateral;  . NO PAST SURGERIES      Current Outpatient Medications on File Prior to Visit  Medication Sig Dispense Refill  . acetaminophen (TYLENOL) 325 MG tablet Take 325-650 mg every 6 (six) hours as needed by mouth for moderate pain or headache.    . Multiple Vitamin (MULTI-VITAMINS) TABS Take 1 tablet daily by mouth.     . sertraline (ZOLOFT) 100 MG tablet Take 100 mg by mouth daily.     No current facility-administered medications on file prior to visit.    Allergies  Allergen Reactions  . Sulfa Antibiotics Other (See Comments)    Childhood allergy    Social History   Socioeconomic History  . Marital status: Married    Spouse  name: Not on file  . Number of children: Not on file  . Years of education: Not on file  . Highest education level: Not on file  Occupational History  . Not on file  Tobacco Use  . Smoking status: Never Smoker  . Smokeless tobacco: Never Used  Vaping Use  . Vaping Use: Never used  Substance and Sexual Activity  . Alcohol use: No  . Drug use: No  . Sexual activity: Yes    Birth control/protection: None, Surgical    Comment: tubal ligation  Other Topics Concern  . Not on file  Social History Narrative  . Not on file   Social Determinants of Health   Financial Resource Strain:   . Difficulty of Paying Living Expenses:   Food Insecurity:   . Worried  About Programme researcher, broadcasting/film/video in the Last Year:   . Barista in the Last Year:   Transportation Needs:   . Freight forwarder (Medical):   Marland Kitchen Lack of Transportation (Non-Medical):   Physical Activity:   . Days of Exercise per Week:   . Minutes of Exercise per Session:   Stress:   . Feeling of Stress :   Social Connections:   . Frequency of Communication with Friends and Family:   . Frequency of Social Gatherings with Friends and Family:   . Attends Religious Services:   . Active Member of Clubs or Organizations:   . Attends Banker Meetings:   Marland Kitchen Marital Status:   Intimate Partner Violence:   . Fear of Current or Ex-Partner:   . Emotionally Abused:   Marland Kitchen Physically Abused:   . Sexually Abused:     Family History  Problem Relation Age of Onset  . Cancer Paternal Aunt        melanoma    The following portions of the patient's history were reviewed and updated as appropriate: allergies, current medications, past family history, past medical history, past social history, past surgical history and problem list.  Review of Systems  ROS negative except as noted above. Information obtained from patient.    Objective:   BP 119/81   Pulse 82   Ht 5\' 7"  (1.702 m)   Wt 157 lb 11.2 oz (71.5 kg)   LMP 03/11/2020   BMI 24.70 kg/m    CONSTITUTIONAL: Well-developed, well-nourished female in no acute distress.   PSYCHIATRIC: Normal mood and affect. Normal behavior. Normal  judgment and thought content.  NEUROLGIC: Alert and oriented to person, place, and time. Normal muscle tone coordination. No cranial nerve deficit noted.  HENT:  Normocephalic, atraumatic, External right and left ear normal.   EYES: Conjunctivae and EOM are normal. Pupils are equal and round.   NECK: Normal range of motion, supple, no masses.  Normal thyroid.   SKIN: Skin is warm and dry. No rash noted. Not diaphoretic. No erythema. No pallor.  CARDIOVASCULAR: Normal heart rate noted,  regular rhythm, no murmur.  RESPIRATORY: Clear to auscultation bilaterally. Effort and breath sounds normal, no problems with respiration noted.  BREASTS: Symmetric in size. No masses, skin changes, nipple drainage, or lymphadenopathy.  ABDOMEN: Soft, normal bowel sounds, no distention noted.  No tenderness, rebound or guarding.   PELVIC:  External Genitalia: Normal  Vagina: Normal  Cervix: Normal, Pap collected  Uterus: Normal  Adnexa: Normal  MUSCULOSKELETAL: Normal range of motion. No tenderness.  No cyanosis, clubbing, or edema.  2+ distal pulses.  LYMPHATIC: No Axillary, Supraclavicular, or Inguinal Adenopathy.  Assessment:   Annual gynecologic examination 30 y.o.   Contraception: tubal ligation   Normal BMI   Problem List Items Addressed This Visit    None    Visit Diagnoses    Well woman exam    -  Primary   Relevant Orders   Pap IG and HPV (high risk) DNA detection   Screening for cervical cancer       Relevant Orders   Pap IG and HPV (high risk) DNA detection      Plan:   Pap: Pap Co Test  Labs: Declined  Routine preventative health maintenance measures emphasized: Exercise/Diet/Weight control, Tobacco Warnings, Alcohol/Substance use risks and Stress Management; see AVS  Reviewed red flag symptoms and when to call  Return to Clinic - 1 Year for [facility] or sooner if needed   Serafina Royals, CNM  [facility], [facility] 03/14/20 5:16 PM

## 2020-03-14 NOTE — Patient Instructions (Addendum)
Rizatriptan disintegrating tablets What is this medicine? RIZATRIPTAN (rye za TRIP tan) is used to treat migraines with or without aura. An aura is a strange feeling or visual disturbance that warns you of an attack. It is not used to prevent migraines. This medicine may be used for other purposes; ask your health care provider or pharmacist if you have questions. COMMON BRAND NAME(S): Maxalt-MLT What should I tell my health care provider before I take this medicine? They need to know if you have any of these conditions:  cigarette smoker  circulation problems in fingers and toes  diabetes  heart disease  high blood pressure  high cholesterol  history of irregular heartbeat  history of stroke  kidney disease  liver disease  stomach or intestine problems  an unusual or allergic reaction to rizatriptan, other medicines, foods, dyes, or preservatives  pregnant or trying to get pregnant  breast-feeding How should I use this medicine? Take this medicine by mouth. Follow the directions on the prescription label. Leave the tablet in the sealed blister pack until you are ready to take it. With dry hands, open the blister and gently remove the tablet. If the tablet breaks or crumbles, throw it away and take a new tablet out of the blister pack. Place the tablet in the mouth and allow it to dissolve, and then swallow. Do not cut, crush, or chew this medicine. You do not need water to take this medicine. Do not take it more often than directed. Talk to your pediatrician regarding the use of this medicine in children. While this drug may be prescribed for children as young as 6 years for selected conditions, precautions do apply. Overdosage: If you think you have taken too much of this medicine contact a poison control center or emergency room at once. NOTE: This medicine is only for you. Do not share this medicine with others. What if I miss a dose? This does not apply. This medicine is  not for regular use. What may interact with this medicine? Do not take this medicine with any of the following medicines:  certain medicines for migraine headache like almotriptan, eletriptan, frovatriptan, naratriptan, rizatriptan, sumatriptan, zolmitriptan  ergot alkaloids like dihydroergotamine, ergonovine, ergotamine, methylergonovine  MAOIs like Carbex, Eldepryl, Marplan, Nardil, and Parnate This medicine may also interact with the following medications:  certain medicines for depression, anxiety, or psychotic disorders  propranolol This list may not describe all possible interactions. Give your health care provider a list of all the medicines, herbs, non-prescription drugs, or dietary supplements you use. Also tell them if you smoke, drink alcohol, or use illegal drugs. Some items may interact with your medicine. What should I watch for while using this medicine? Visit your healthcare professional for regular checks on your progress. Tell your healthcare professional if your symptoms do not start to get better or if they get worse. You may get drowsy or dizzy. Do not drive, use machinery, or do anything that needs mental alertness until you know how this medicine affects you. Do not stand up or sit up quickly, especially if you are an older patient. This reduces the risk of dizzy or fainting spells. Alcohol may interfere with the effect of this medicine. Your mouth may get dry. Chewing sugarless gum or sucking hard candy and drinking plenty of water may help. Contact your healthcare professional if the problem does not go away or is severe. If you take migraine medicines for 10 or more days a month, your migraines may  get worse. Keep a diary of headache days and medicine use. Contact your healthcare professional if your migraine attacks occur more frequently. What side effects may I notice from receiving this medicine? Side effects that you should report to your doctor or health care  professional as soon as possible:  allergic reactions like skin rash, itching or hives, swelling of the face, lips, or tongue  chest pain or chest tightness  signs and symptoms of a dangerous change in heartbeat or heart rhythm like chest pain; dizziness; fast, irregular heartbeat; palpitations; feeling faint or lightheaded; falls; breathing problems  signs and symptoms of a stroke like changes in vision; confusion; trouble speaking or understanding; severe headaches; sudden numbness or weakness of the face, arm or leg; trouble walking; dizziness; loss of balance or coordination  signs and symptoms of serotonin syndrome like irritable; confusion; diarrhea; fast or irregular heartbeat; muscle twitching; stiff muscles; trouble walking; sweating; high fever; seizures; chills; vomiting Side effects that usually do not require medical attention (report to your doctor or health care professional if they continue or are bothersome):  diarrhea  dizziness  drowsiness  dry mouth  headache  nausea, vomiting  pain, tingling, numbness in the hands or feet  stomach pain This list may not describe all possible side effects. Call your doctor for medical advice about side effects. You may report side effects to FDA at 1-800-FDA-1088. Where should I keep my medicine? Keep out of the reach of children. Store at room temperature between 15 and 30 degrees C (59 and 86 degrees F). Protect from light and moisture. Throw away any unused medicine after the expiration date. NOTE: This sheet is a summary. It may not cover all possible information. If you have questions about this medicine, talk to your doctor, pharmacist, or health care provider.  2020 Elsevier/Gold Standard (2018-02-18 14:58:08)   Preventive Care 67-59 Years Old, Female Preventive care refers to visits with your health care provider and lifestyle choices that can promote health and wellness. This includes:  A yearly physical exam. This  may also be called an annual well check.  Regular dental visits and eye exams.  Immunizations.  Screening for certain conditions.  Healthy lifestyle choices, such as eating a healthy diet, getting regular exercise, not using drugs or products that contain nicotine and tobacco, and limiting alcohol use. What can I expect for my preventive care visit? Physical exam Your health care provider will check your:  Height and weight. This may be used to calculate body mass index (BMI), which tells if you are at a healthy weight.  Heart rate and blood pressure.  Skin for abnormal spots. Counseling Your health care provider may ask you questions about your:  Alcohol, tobacco, and drug use.  Emotional well-being.  Home and relationship well-being.  Sexual activity.  Eating habits.  Work and work Statistician.  Method of birth control.  Menstrual cycle.  Pregnancy history. What immunizations do I need?  Influenza (flu) vaccine  This is recommended every year. Tetanus, diphtheria, and pertussis (Tdap) vaccine  You may need a Td booster every 10 years. Varicella (chickenpox) vaccine  You may need this if you have not been vaccinated. Human papillomavirus (HPV) vaccine  If recommended by your health care provider, you may need three doses over 6 months. Measles, mumps, and rubella (MMR) vaccine  You may need at least one dose of MMR. You may also need a second dose. Meningococcal conjugate (MenACWY) vaccine  One dose is recommended if you are  age 19-21 years and a first-year college student living in a residence hall, or if you have one of several medical conditions. You may also need additional booster doses. Pneumococcal conjugate (PCV13) vaccine  You may need this if you have certain conditions and were not previously vaccinated. Pneumococcal polysaccharide (PPSV23) vaccine  You may need one or two doses if you smoke cigarettes or if you have certain  conditions. Hepatitis A vaccine  You may need this if you have certain conditions or if you travel or work in places where you may be exposed to hepatitis A. Hepatitis B vaccine  You may need this if you have certain conditions or if you travel or work in places where you may be exposed to hepatitis B. Haemophilus influenzae type b (Hib) vaccine  You may need this if you have certain conditions. You may receive vaccines as individual doses or as more than one vaccine together in one shot (combination vaccines). Talk with your health care provider about the risks and benefits of combination vaccines. What tests do I need?  Blood tests  Lipid and cholesterol levels. These may be checked every 5 years starting at age 40.  Hepatitis C test.  Hepatitis B test. Screening  Diabetes screening. This is done by checking your blood sugar (glucose) after you have not eaten for a while (fasting).  Sexually transmitted disease (STD) testing.  BRCA-related cancer screening. This may be done if you have a family history of breast, ovarian, tubal, or peritoneal cancers.  Pelvic exam and Pap test. This may be done every 3 years starting at age 1. Starting at age 50, this may be done every 5 years if you have a Pap test in combination with an HPV test. Talk with your health care provider about your test results, treatment options, and if necessary, the need for more tests. Follow these instructions at home: Eating and drinking   Eat a diet that includes fresh fruits and vegetables, whole grains, lean protein, and low-fat dairy.  Take vitamin and mineral supplements as recommended by your health care provider.  Do not drink alcohol if: ? Your health care provider tells you not to drink. ? You are pregnant, may be pregnant, or are planning to become pregnant.  If you drink alcohol: ? Limit how much you have to 0-1 drink a day. ? Be aware of how much alcohol is in your drink. In the [place] one  drink equals one 12 oz bottle of beer (355 mL), one 5 oz glass of wine (148 mL), or one 1 oz glass of hard liquor (44 mL). Lifestyle  Take daily care of your teeth and gums.  Stay active. Exercise for at least 30 minutes on 5 or more days each week.  Do not use any products that contain nicotine or tobacco, such as cigarettes, e-cigarettes, and chewing tobacco. If you need help quitting, ask your health care provider.  If you are sexually active, practice safe sex. Use a condom or other form of birth control (contraception) in order to prevent pregnancy and STIs (sexually transmitted infections). If you plan to become pregnant, see your health care provider for a preconception visit. What's next?  Visit your health care provider once a year for a well check visit.  Ask your health care provider how often you should have your eyes and teeth checked.  Stay up to date on all vaccines. This information is not intended to replace advice given to you by your health care  provider. Make sure you discuss any questions you have with your health care provider. Document Revised: 04/17/2018 Document Reviewed: 04/17/2018 Elsevier Patient Education  2020 [employer] Breast self-awareness is knowing how your breasts look and feel. Doing breast self-awareness is important. It allows you to catch a breast problem early while it is still small and can be treated. All women should do breast self-awareness, including women who have had breast implants. Tell your doctor if you notice a change in your breasts. What you need:  A mirror.  A well-lit room. How to do a breast self-exam A breast self-exam is one way to learn what is normal for your breasts and to check for changes. To do a breast self-exam: Look for changes  1. Take off all the clothes above your waist. 2. Stand in front of a mirror in a room with good lighting. 3. Put your hands on your hips. 4. Push your hands  down. 5. Look at your breasts and nipples in the mirror to see if one breast or nipple looks different from the other. Check to see if: ? The shape of one breast is different. ? The size of one breast is different. ? There are wrinkles, dips, and bumps in one breast and not the other. 6. Look at each breast for changes in the skin, such as: ? Redness. ? Scaly areas. 7. Look for changes in your nipples, such as: ? Liquid around the nipples. ? Bleeding. ? Dimpling. ? Redness. ? A change in where the nipples are. Feel for changes  1. Lie on your back on the floor. 2. Feel each breast. To do this, follow these steps: ? Pick a breast to feel. ? Put the arm closest to that breast above your head. ? Use your other arm to feel the nipple area of your breast. Feel the area with the pads of your three middle fingers by making small circles with your fingers. For the first circle, press lightly. For the second circle, press harder. For the third circle, press even harder. ? Keep making circles with your fingers at the different pressures as you move down your breast. Stop when you feel your ribs. ? Move your fingers a little toward the center of your body. ? Start making circles with your fingers again, this time going up until you reach your collarbone. ? Keep making up-and-down circles until you reach your armpit. Remember to keep using the three pressures. ? Feel the other breast in the same way. 3. Sit or stand in the tub or shower. 4. With soapy water on your skin, feel each breast the same way you did in step 2 when you were lying on the floor. Write down what you find Writing down what you find can help you remember what to tell your doctor. Write down:  What is normal for each breast.  Any changes you find in each breast, including: ? The kind of changes you find. ? Whether you have pain. ? Size and location of any lumps.  When you last had your menstrual period. General  tips  Check your breasts every month.  If you are breastfeeding, the best time to check your breasts is after you feed your baby or after you use a breast pump.  If you get menstrual periods, the best time to check your breasts is 5-7 days after your menstrual period is over.  With time, you will become comfortable with the self-exam, and you will  begin to know if there are changes in your breasts. Contact a doctor if you:  See a change in the shape or size of your breasts or nipples.  See a change in the skin of your breast or nipples, such as red or scaly skin.  Have fluid coming from your nipples that is not normal.  Find a lump or thick area that was not there before.  Have pain in your breasts.  Have any concerns about your breast health. Summary  Breast self-awareness includes looking for changes in your breasts, as well as feeling for changes within your breasts.  Breast self-awareness should be done in front of a mirror in a well-lit room.  You should check your breasts every month. If you get menstrual periods, the best time to check your breasts is 5-7 days after your menstrual period is over.  Let your doctor know of any changes you see in your breasts, including changes in size, changes on the skin, pain or tenderness, or fluid from your nipples that is not normal. This information is not intended to replace advice given to you by your health care provider. Make sure you discuss any questions you have with your health care provider. Document Revised: 03/25/2018 Document Reviewed: 03/25/2018 Elsevier Patient Education  [place].

## 2020-03-14 NOTE — Progress Notes (Signed)
Pt present for annual exam. Pt stated that she started back on her Zoloft about 11 months ago and stated that she feels like she needs something else to help better balance her moods. Pt stated that she will discuss the issues with her PCP. Pt denies any gyn issues at this time.

## 2020-03-15 ENCOUNTER — Ambulatory Visit: Payer: Managed Care, Other (non HMO) | Attending: Internal Medicine

## 2020-03-15 DIAGNOSIS — Z23 Encounter for immunization: Secondary | ICD-10-CM

## 2020-03-15 NOTE — Progress Notes (Signed)
   Covid-19 Vaccination Clinic  Name:  Sara Webb    MRN: [id number] DOB: 1990-12-05  03/15/2020  Sara Webb was observed post Covid-19 immunization for 15 minutes without incident. She was provided with Vaccine Information Sheet and instruction to access the V-Safe system.   Sara Webb was instructed to call 911 with any severe reactions post vaccine: Marland Kitchen Difficulty breathing  . Swelling of face and throat  . A fast heartbeat  . A bad rash all over body  . Dizziness and weakness   Immunizations Administered    Name Date Dose VIS Date Route   Pfizer COVID-19 Vaccine 03/15/2020  1:20 PM 0.3 mL 10/14/2018 Intramuscular   Manufacturer: [employer], [employer]   Lot: [id number]   NDC: [address]-6

## 2020-03-17 ENCOUNTER — Other Ambulatory Visit: Payer: Self-pay | Admitting: Certified Nurse Midwife

## 2020-03-17 DIAGNOSIS — G43909 Migraine, unspecified, not intractable, without status migrainosus: Secondary | ICD-10-CM

## 2020-03-17 MED ORDER — RIZATRIPTAN BENZOATE 10 MG PO TBDP: 10.0000 mg | ORAL_TABLET | ORAL | 1 refills | Status: DC | PRN

## 2020-03-17 NOTE — Progress Notes (Signed)
Rx Maxalt-MLT, see orders.    Serafina Royals, CNM [facility], [facility] 03/17/20 5:11 PM

## 2020-03-18 LAB — IGP, APTIMA HPV, RFX 16/18,45: HPV Aptima: NEGATIVE

## 2020-04-04 ENCOUNTER — Ambulatory Visit: Payer: Managed Care, Other (non HMO) | Attending: Internal Medicine

## 2020-04-04 ENCOUNTER — Ambulatory Visit: Payer: Managed Care, Other (non HMO)

## 2020-04-04 DIAGNOSIS — Z23 Encounter for immunization: Secondary | ICD-10-CM

## 2020-04-04 NOTE — Progress Notes (Signed)
   Covid-19 Vaccination Clinic  Name:  Sara Webb    MRN: [id number] DOB: 1990/10/20  04/04/2020  [name] was observed post Covid-19 immunization for 15 minutes without incident. She was provided with Vaccine Information Sheet and instruction to access the V-Safe system.   [name] was instructed to call 911 with any severe reactions post vaccine: Marland Kitchen Difficulty breathing  . Swelling of face and throat  . A fast heartbeat  . A bad rash all over body  . Dizziness and weakness   Immunizations Administered    Name Date Dose VIS Date Route   Pfizer COVID-19 Vaccine 04/04/2020  2:59 PM 0.3 mL 10/14/2018 Intramuscular   Manufacturer: [employer], [employer]   Lot: [id number]   NDC: [address]-0

## 2020-05-23 DIAGNOSIS — G5762 Lesion of plantar nerve, left lower limb: Secondary | ICD-10-CM | POA: Insufficient documentation

## 2020-06-01 ENCOUNTER — Ambulatory Visit: Payer: Managed Care, Other (non HMO) | Admitting: Podiatry

## 2020-06-01 ENCOUNTER — Encounter: Payer: Self-pay | Admitting: Podiatry

## 2020-06-01 ENCOUNTER — Other Ambulatory Visit: Payer: Self-pay | Admitting: Podiatry

## 2020-06-01 ENCOUNTER — Ambulatory Visit (INDEPENDENT_AMBULATORY_CARE_PROVIDER_SITE_OTHER): Payer: Managed Care, Other (non HMO)

## 2020-06-01 ENCOUNTER — Other Ambulatory Visit: Payer: Self-pay

## 2020-06-01 DIAGNOSIS — M7989 Other specified soft tissue disorders: Secondary | ICD-10-CM

## 2020-06-01 DIAGNOSIS — S9032XA Contusion of left foot, initial encounter: Secondary | ICD-10-CM

## 2020-06-01 DIAGNOSIS — G5780 Other specified mononeuropathies of unspecified lower limb: Secondary | ICD-10-CM

## 2020-06-01 NOTE — Progress Notes (Signed)
Subjective:  Patient ID: Sara Webb, female    DOB: 09-27-1990,  MRN: [id number] HPI Chief Complaint  Patient presents with  . 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 New Patient (Initial Visit)    29 y.o. female presents with the above complaint.   ROS: Denies fever chills nausea vomiting muscle aches pains calf pain back pain chest pain shortness of breath.  Past Medical History:  Diagnosis Date  . Family history of adverse reaction to anesthesia    aunt  . migraine    Past Surgical History:  Procedure Laterality Date  . DILATION AND CURETTAGE OF UTERUS N/A 07/15/2017   Procedure: DILATATION AND CURETTAGE;  Surgeon: 07/17/2017, MD;  Location: [facility] ORS;  Service: Gynecology;  Laterality: N/A;  . LAPAROSCOPIC OVARIAN CYSTECTOMY Right 05/18/2016   Procedure: LAPAROSCOPIC OVARIAN CYSTECTOMY;  Surgeon: 05/20/2016, MD;  Location: [facility] ORS;  Service: Gynecology;  Laterality: Right;  . LAPAROSCOPIC TUBAL LIGATION Bilateral 07/15/2017   Procedure: LAPAROSCOPIC BILATERAL TUBAL LIGATION;  Surgeon: 07/17/2017, MD;  Location: [facility] ORS;  Service: Gynecology;  Laterality: Bilateral;  . NO PAST SURGERIES      Current Outpatient Medications:  .  fluticasone (FLONASE) 50 MCG/ACT nasal spray, SHAKE LIQUID AND USE 1 SPRAY IN EACH NOSTRIL TWICE DAILY, Disp: , Rfl:  .  loratadine (CLARITIN) 10 MG tablet, Take 10 mg by mouth daily., Disp: , Rfl:  .  acetaminophen (TYLENOL) 325 MG tablet, Take 325-650 mg every 6 (six) hours as needed by mouth for moderate pain or headache., Disp: , Rfl:  .   Multiple Vitamin (MULTI-VITAMINS) TABS, Take 1 tablet daily by mouth. , Disp: , Rfl:  .  sertraline (ZOLOFT) 100 MG tablet, Take 100 mg by mouth daily., Disp: , Rfl:   Allergies  Allergen Reactions  . Sulfa Antibiotics Other (See Comments)    Childhood allergy   Review of Systems Objective:  There were no vitals filed for this visit.  General: Well developed, nourished, in no acute distress, alert and oriented x3   Dermatological: Skin is warm, dry and supple bilateral. Nails x 10 are well maintained; remaining integument appears unremarkable at this time. There are no open sores, no preulcerative lesions, no rash or signs of infection present.  Vascular: Dorsalis Pedis artery and Posterior Tibial artery pedal pulses are 2/4 bilateral with immedate capillary fill time. Pedal hair growth present. No varicosities and no lower extremity edema present bilateral.   Neruologic: Grossly intact via light touch bilateral. Vibratory intact via tuning fork bilateral. Protective threshold with Semmes Wienstein monofilament intact to all pedal sites bilateral. Patellar and Achilles deep tendon reflexes 2+ bilateral. No Babinski or clonus noted bilateral.   Musculoskeletal: No gross boney pedal deformities bilateral. No pain, crepitus, or limitation noted with foot and ankle range of motion bilateral. Muscular strength 5/5 in all groups tested bilateral.  She has pain on palpation of the fourth intermetatarsal space distally around the intermetatarsal ligament.  There is no radiating pain with palpation proximally or distally and there is no palpable Mulder's click.  It appears to  be a firm mass in this area possibly scar tissue.  She has no pain on palpation of the fifth metatarsal itself.  Gait: Unassisted, Nonantalgic.    Radiographs:  Radiographs taken today demonstrate an osseously mature individual soft tissue thickening in the fourth intermetatarsal space with an area of sclerosis to the bone  around the anatomical neck of the bone.  This does not appear to be an old fracture.  Assessment & Plan:   Assessment: Soft tissue mass fourth metatarsal space secondary to trauma  Plan: Discussed etiology pathology conservative surgical therapies.  I am going to go ahead and request an MRI since other conservative therapies including injection therapy steroids nonsteroidals immobilization have failed to render her asymptomatic.     Sara Webb T. [place], [place]

## 2020-06-13 ENCOUNTER — Ambulatory Visit
Admission: RE | Admit: 2020-06-13 | Discharge: 2020-06-13 | Disposition: A | Discharge status: Home / Self Care | Payer: Managed Care, Other (non HMO) | Source: Ambulatory Visit | Attending: Podiatry | Admitting: Podiatry

## 2020-06-13 DIAGNOSIS — M7989 Other specified soft tissue disorders: Secondary | ICD-10-CM

## 2020-06-13 DIAGNOSIS — S9032XA Contusion of left foot, initial encounter: Secondary | ICD-10-CM

## 2020-06-13 MED ORDER — GADOBENATE DIMEGLUMINE 529 MG/ML IV SOLN
14.0000 mL | Freq: Once | INTRAVENOUS | Status: AC | PRN
  Administered 2020-06-13: 14 mL via INTRAVENOUS

## 2020-06-23 ENCOUNTER — Telehealth: Payer: Self-pay | Admitting: *Deleted

## 2020-06-23 ENCOUNTER — Telehealth: Payer: Self-pay

## 2020-06-23 NOTE — Telephone Encounter (Signed)
Request for MRI disc and report has been called to [facility] radiology Mindi Junker)

## 2020-06-23 NOTE — Telephone Encounter (Signed)
I spoke with patient and informed her of overread and delay.  She will call back in about a week to schedule follow up MRI appt with Dr. Al Corpus.

## 2020-06-23 NOTE — Telephone Encounter (Signed)
"  I had a MRI done on 06/13/2020.  I haven't heard anything.  I left a message on the nurse's voicemal last week iinquiring about it and I didn't receive a return call."  Dr. Al Corpus has probably sent it to be read by another Radiologist.  The nurse will call you once Dr. Al Corpus reviews the final result.  "Okay, thank you so much.  I was concerned because I hadn't heard anything.

## 2020-07-07 ENCOUNTER — Telehealth: Payer: Self-pay

## 2020-07-07 NOTE — Telephone Encounter (Signed)
Mailed MRI disc and report to [facility] 

## 2020-07-21 ENCOUNTER — Encounter: Payer: Self-pay | Admitting: *Deleted

## 2020-08-08 ENCOUNTER — Ambulatory Visit: Payer: Managed Care, Other (non HMO) | Admitting: Podiatry

## 2020-08-18 ENCOUNTER — Telehealth: Payer: Self-pay

## 2020-08-18 NOTE — Telephone Encounter (Signed)
-----   Message from Elinor Parkinson, [place] sent at 08/18/2020  6:44 AM EST ----- I have had the MRI re evaluated and there is no evidence of a mass/tumor/ bone involvement of the area in question..  NEGATIVE MRI.

## 2020-08-18 NOTE — Telephone Encounter (Signed)
Patient has been notified of results.  She will keep appt with Dr. Al Corpus next week and discuss other treatment options then

## 2020-08-24 ENCOUNTER — Other Ambulatory Visit: Payer: Self-pay

## 2020-08-24 ENCOUNTER — Ambulatory Visit: Payer: Managed Care, Other (non HMO) | Admitting: Podiatry

## 2020-08-24 ENCOUNTER — Encounter: Payer: Self-pay | Admitting: Podiatry

## 2020-08-24 DIAGNOSIS — M7989 Other specified soft tissue disorders: Secondary | ICD-10-CM

## 2020-08-24 NOTE — Progress Notes (Signed)
She presents today for follow-up of her MRI.  She states that her foot is doing a little better than it was when she was here last time states that she is still not able to do high-impact activities because it but it becomes painful after a while.  Objective: Vital signs are stable she is alert oriented x3 much decrease in palpable mass to the interspace of the fourth left foot.  MRI demonstrates negative findings.  Assessment: Most likely this is a ganglion or intermetatarsal bursitis that flares up with heavy activity.  Plan: I encouraged her to get back to her regular routine follow-up with me with changes or worsening of her condition.  We did discuss possibly just having to go to work on this.

## 2020-09-12 NOTE — Progress Notes (Unsigned)
[facility] [address] [place] [address] Phone: [phone] Subjective:   Bruce Donath, am serving as a scribe for Dr. Antoine Primas. This visit occurred during the SARS-CoV-2 public health emergency.  Safety protocols were in place, including screening questions prior to the visit, additional usage of staff PPE, and extensive cleaning of exam room while observing appropriate contact time as indicated for disinfecting solutions.   I'm seeing this patient by the request  of:  Kandyce Rud, MD  CC: Left foot injury  [id number]  Sara Webb is a 30 y.o. female coming in with complaint of left foot pain since April 2021. Dropped chair on 4th and 5th metatarsal of left foot. Has been seen at [facility] and [facility]. Had xrays. Was not broken. Pain went away but pain began to come back in August 2021 especially after workouts. Tried boot, injectoin gabapentin, Rx NSAIDs. Patient states that she saw podiatry and they recommended surgery to look inside foot but recommended to wait until she cannot stand it anymore.   Also states that she hit right 5th toe on couch last Thursday and feels like she broke her toe. Has been buddy taping toes.     Patient did have an MRI of the left foot done on June 13, 2020.  This was independently visualized by me showing the patient did have very mild intermetatarsal bursal fluid in the second and third spaces but no true neuroma noted.  Past Medical History:  Diagnosis Date  . Family history of adverse reaction to anesthesia    aunt  . migraine    Past Surgical History:  Procedure Laterality Date  . DILATION AND CURETTAGE OF UTERUS N/A 07/15/2017   Procedure: DILATATION AND CURETTAGE;  Surgeon: Hildred Laser, MD;  Location: [facility] ORS;  Service: Gynecology;  Laterality: N/A;  . LAPAROSCOPIC OVARIAN CYSTECTOMY Right 05/18/2016   Procedure: LAPAROSCOPIC OVARIAN CYSTECTOMY;  Surgeon: Nadara Mustard, MD;   Location: [facility];  Service: Gynecology;  Laterality: Right;  . LAPAROSCOPIC TUBAL LIGATION Bilateral 07/15/2017   Procedure: LAPAROSCOPIC BILATERAL TUBAL LIGATION;  Surgeon: Hildred Laser, MD;  Location: [facility] OR[facility];  Service: Gynecology;  Laterality: Bilateral;  . NO PAST SURGERIES     Social History   Socioeconomic History  . Marital status: Married    Spouse name: Not on file  . Number of children: Not on file  . Years of education: Not on file  . Highest education level: Not on file  Occupational History  . Not on file  Tobacco Use  . Smoking status: Never Smoker  . Smokeless tobacco: Never Used  Vaping Use  . Vaping Use: Never used  Substance and Sexual Activity  . Alcohol use: No  . Drug use: No  . Sexual activity: Yes    Birth control/protection: None, Surgical    Comment: tubal ligation  Other Topics Concern  . Not on file  Social History Narrative  . Not on file   Social Determinants of Health   Financial Resource Strain: Not on file  Food Insecurity: Not on file  Transportation Needs: Not on file  Physical Activity: Not on file  Stress: Not on file  Social Connections: Not on file   Allergies  Allergen Reactions  . Sulfa Antibiotics Other (See Comments)    Childhood allergy   Family History  Problem Relation Age of Onset  . Cancer Paternal Aunt        melanoma  Current Outpatient Medications (Respiratory):  .  fluticasone (FLONASE) 50 MCG/ACT nasal spray, SHAKE LIQUID AND USE 1 SPRAY IN EACH NOSTRIL TWICE DAILY .  loratadine (CLARITIN) 10 MG tablet, Take 10 mg by mouth daily.  Current Outpatient Medications (Analgesics):  .  acetaminophen (TYLENOL) 325 MG tablet, Take 325-650 mg every 6 (six) hours as needed by mouth for moderate pain or headache.   Current Outpatient Medications (Other):  Marland Kitchen  Multiple Vitamin (MULTI-VITAMINS) TABS, Take 1 tablet daily by mouth.  .  sertraline (ZOLOFT) 100 MG tablet, Take 100 mg by mouth daily.   Reviewed  prior external information including notes and imaging from  primary care provider As well as notes that were available from care everywhere and other healthcare systems.  Past medical history, social, surgical and family history all reviewed in electronic medical record.  No pertanent information unless stated regarding to the chief complaint.   Review of Systems:  No  visual changes, nausea, vomiting, diarrhea, constipation, dizziness, abdominal pain, skin rash, fevers, chills, night sweats, weight loss, swollen lymph nodes, body aches, joint swelling, chest pain, shortness of breath, mood changes. POSITIVE muscle aches, headache  Objective  Blood pressure 118/88, pulse 67, height 5\' 7"  (1.702 m), weight 166 lb (75.3 kg), last menstrual period 08/20/2020, SpO2 99 %.   General: No apparent distress alert and oriented x3 mood and affect normal, dressed appropriately.  HEENT: Pupils equal, extraocular movements intact  Respiratory: Patient's speak in full sentences and does not appear short of breath  Cardiovascular: No lower extremity edema, non tender, no erythema  Gait mild antalgic MSK:  Neck exam does have some mild loss of lordosis.  Patient does have some mild tenderness in the right parascapular region.  Very mild scapular dyskinesis noted on the right side.  Negative Spurling's.  5/5 strength of the upper extremity.  Foot exam shows the patient does have breakdown of the transverse arch.  This is on the left side.  Patient does have some mild increase in discomfort with squeeze test.  Mild pain between the fourth and fifth metatarsal heads.  No pain over the bone themselves.  Ankle otherwise seems to be unremarkable with may be just mild loss of 2 degrees of dorsiflexion.  Limited musculoskeletal ultrasound was performed and interpreted by 10/18/2020  Limited ultrasound of patient's foot was fairly unremarkable.  Between the fourth and fifth metatarsal patient does have very mild  hypoechoic changes within the neuronal sheath in the intermetatarsal nerve but I do not see a true neuroma noted.  Does not seem to be subluxed between the toes either. Impression: Fairly unremarkable ultrasound of the left foot  Osteopathic findings C3 flexed rotated and side bent right T4 extended rotated and side bent right with inhaled rib    Impression and Recommendations:     The above documentation has been reviewed and is accurate and complete Judi Saa, DO

## 2020-09-13 ENCOUNTER — Encounter: Payer: Self-pay | Admitting: Family Medicine

## 2020-09-13 ENCOUNTER — Other Ambulatory Visit: Payer: Self-pay

## 2020-09-13 ENCOUNTER — Ambulatory Visit: Payer: Managed Care, Other (non HMO) | Admitting: Family Medicine

## 2020-09-13 ENCOUNTER — Ambulatory Visit: Payer: Self-pay

## 2020-09-13 VITALS — BP 118/88 | HR 67 | Ht 67.0 in | Wt 166.0 lb

## 2020-09-13 DIAGNOSIS — M542 Cervicalgia: Secondary | ICD-10-CM

## 2020-09-13 DIAGNOSIS — M999 Biomechanical lesion, unspecified: Secondary | ICD-10-CM | POA: Diagnosis not present

## 2020-09-13 DIAGNOSIS — M79672 Pain in left foot: Secondary | ICD-10-CM | POA: Diagnosis not present

## 2020-09-13 DIAGNOSIS — G4486 Cervicogenic headache: Secondary | ICD-10-CM

## 2020-09-13 NOTE — Assessment & Plan Note (Signed)
Patient states has had this intermittently for some time.  Responded fairly well to osteopathic manipulation.  Discussed potential posture and ergonomics.  We will give her likely scapular exercises at next follow-up.  X-rays of the neck ordered today to further evaluate follow-up again 6 weeks

## 2020-09-13 NOTE — Assessment & Plan Note (Signed)
   Decision today to treat with OMT was based on Physical Exam  After verbal consent patient was treated with  ME, FPR techniques in cervical, thoracic, rib, areas,  Patient tolerated the procedure well with improvement in symptoms  Patient given exercises, stretches and lifestyle modifications  See medications in patient instructions if given  Patient will follow up in 6 weeks

## 2020-09-13 NOTE — Patient Instructions (Signed)
Good to see you.  Try arnica lotion  [employer] orthotics "total support" online would be great  hoka recover or oofos in the house.  Avoid being barefoot Arnica lotion for potential pain voltaren gel is another alternative Do not lace last eye on the shoe For weight lifting look into New balance minimus  See me again in 6 weeks

## 2020-09-13 NOTE — Assessment & Plan Note (Signed)
I believe the patient's pain seems to be multifactorial.  Patient does have breakdown of the transverse arch that I think is contributing to more the discomfort and pain at this time.  Discussed icing regimen and home exercises.  Discussed over-the-counter orthotics that I think will be beneficial.  Shoes to wear in the house as well as workout shoes.  Patient work with Event organiser to learn home exercises.  Follow-up again in 6 weeks.  Worsening pain we can always consider potential laboratory work-up.

## 2020-10-24 NOTE — Progress Notes (Signed)
Sara Webb [address] [place] [address] Phone: [phone] Subjective:   Sara Webb, am serving as a scribe for Dr. Antoine Webb. This visit occurred during the SARS-CoV-2 public health emergency.  Safety protocols were in place, including screening questions prior to the visit, additional usage of staff PPE, and extensive cleaning of exam room while observing appropriate contact time as indicated for disinfecting solutions.   I'm seeing this patient by the request  of:  Sara Rud, MD  CC: Foot exam follow-up  FFM:[id number]   09/13/2020 I believe the patient's pain seems to be multifactorial.  Patient does have breakdown of the transverse arch that I think is contributing to more the discomfort and pain at this time.  Discussed icing regimen and home exercises.  Discussed over-the-counter orthotics that I think will be beneficial.  Shoes to wear in the house as well as workout shoes.  Patient work with Event organiser to learn home exercises.  Follow-up again in 6 weeks.  Worsening pain we can always consider potential laboratory work-up.  Updat 10/25/2020 Sara Webb is a 30 y.o. female coming in with complaint of back and neck pain. Also f/u for left foot pain. OMT 1//25/2022. Patient states that she continues to have achy pain between 4th and 5th metatarsal with exercises and any other weight bearing. Using insoles and Inovvate minimalist shoes. Able to workout if she keeps her foot flat but is unable to perform exercises that are on her toes as this increases her pian. Does wear tennis shoes in the house.   Also having right knee pain, anterior, medial. Pain with squatting and lunging. Has been taping using KT tape. Has always had popping in her knee.   Is not having as many headaches. Is wearing mouthguard at night which has alleviate her pain.            Reviewed prior external information including notes and imaging from  previsou exam, outside providers and external EMR if available.   As well as notes that were available from care everywhere and other healthcare systems.  Past medical history, social, surgical and family history all reviewed in electronic medical record.  No pertanent information unless stated regarding to the chief complaint.   Past Medical History:  Diagnosis Date  . Family history of adverse reaction to anesthesia    aunt  . migraine     Allergies  Allergen Reactions  . Sulfa Antibiotics Other (See Comments)    Childhood allergy     Review of Systems:  No headache, visual changes, nausea, vomiting, diarrhea, constipation, dizziness, abdominal pain, skin rash, fevers, chills, night sweats, weight loss, swollen lymph nodes, body aches, joint swelling, chest pain, shortness of breath, mood changes. POSITIVE muscle aches  Objective  There were no vitals taken for this visit.   General: No apparent distress alert and oriented x3 mood and affect normal, dressed appropriately.  HEENT: Pupils equal, extraocular movements intact  Respiratory: Patient's speak in full sentences and does not appear short of breath  Cardiovascular: No lower extremity edema, non tender, no erythema  Gait normal with good balance and coordination.  MSK:  Non tender with full range of motion and good stability and symmetric strength and tone of shoulders, elbows, wrist, hip, knee and ankles bilaterally.  Left foot exam still shows a breakdown of the transverse arch.  Positive squeeze test noted.  Tenderness to palpation between the fourth and fifth metatarsal heads and patient  does actually have a fullness noted that is different than previous exam.  Procedure: Real-time Ultrasound Guided Injection of left fourth and fifth intermetatarsal space Device: GE Logiq Q7 Ultrasound guided injection is preferred based studies that show increased duration, increased effect, greater accuracy, decreased procedural pain,  increased response rate, and decreased cost with ultrasound guided versus blind injection.  Verbal informed consent obtained.  Time-out conducted.  Noted no overlying erythema, induration, or other signs of local infection.  Skin prepped in a sterile fashion.  Local anesthesia: Topical Ethyl chloride.  With sterile technique and under real time ultrasound guidance: With a 25-gauge half inch needle injected with 0.5 cc of 0.5% Marcaine and 0.5 cc of Kenalog 40 mg/mL in what appears to be a possible neuroma. Completed without difficulty  Pain immediately resolved suggesting accurate placement of the medication.  Advised to call if fevers/chills, erythema, induration, drainage, or persistent bleeding.  Impression: Technically successful ultrasound guided injection.      Assessment and Plan:    Patient will follow up in 4-8 weeks      The above documentation has been reviewed and is accurate and complete Sara Webb       Note: This dictation was prepared with Dragon dictation along with smaller phrase technology. Any transcriptional errors that result from this process are unintentional.

## 2020-10-25 ENCOUNTER — Ambulatory Visit: Payer: Self-pay

## 2020-10-25 ENCOUNTER — Ambulatory Visit: Payer: Managed Care, Other (non HMO) | Admitting: Family Medicine

## 2020-10-25 ENCOUNTER — Other Ambulatory Visit: Payer: Self-pay

## 2020-10-25 ENCOUNTER — Encounter: Payer: Self-pay | Admitting: Family Medicine

## 2020-10-25 VITALS — BP 122/82 | HR 60 | Ht 67.0 in | Wt 168.0 lb

## 2020-10-25 DIAGNOSIS — M222X1 Patellofemoral disorders, right knee: Secondary | ICD-10-CM

## 2020-10-25 DIAGNOSIS — M79672 Pain in left foot: Secondary | ICD-10-CM

## 2020-10-25 DIAGNOSIS — M25561 Pain in right knee: Secondary | ICD-10-CM

## 2020-10-25 NOTE — Assessment & Plan Note (Signed)
Patient on ultrasound today did seem to have may be a possible neuroma noted.  Discussed with patient about the possibility of custom orthotics with patient being very active and wanting to workout on a more regular basis.  Patient will be referred today for this as well.  I do believe patient should do relatively well with the injection and with this.  Patient wants to avoid any type of medications if possible.  Follow-up with me again in 6 to 8 weeks

## 2020-10-25 NOTE — Patient Instructions (Addendum)
Injected neuroma today Physical therapy will be calling you See me back in 6 weeks

## 2020-10-25 NOTE — Assessment & Plan Note (Signed)
Mild patellofemoral that likely is exacerbated with patient compensating for the foot.  We will start with formal physical therapy.  Continue the KT taping.  Patient though does have good VMO strengthening do think this more secondary to just mild lateral translation of the patella.  Follow-up again in 6 to 8 weeks worsening pain consider potential injection

## 2020-11-19 IMAGING — MR MR FOOT*L* WO/W CM
4 of 10 series · 16 of 40 positions shown · IV contrast (14ml Multihance)
Comparison: Radiographs 06/01/2020.

CLINICAL DATA: Palpable soft tissue mass/tenderness dorsally over
the 4th and 5th metatarsals. Foot injury 6 months ago.

EXAM:
MRI OF THE LEFT FOREFOOT WITHOUT AND WITH CONTRAST
TECHNIQUE: Multiplanar, multisequence MR imaging of the left forefoot was
performed both before and after administration of intravenous
contrast.
CONTRAST:  14mL MULTIHANCE GADOBENATE DIMEGLUMINE 529 MG/ML IV SOLN

[Series 5: T2 fat-sat · coronal · 3.0mm · 0.19mm/px · 6 of 47 slices shown (1 of 2)]
[im 1/47]
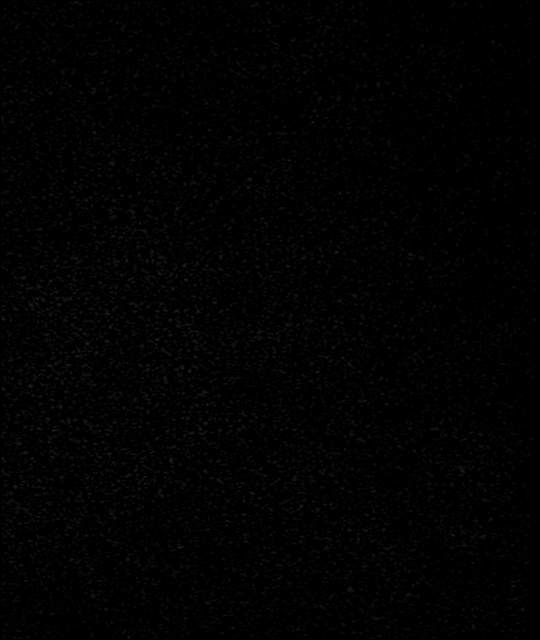
[im 10/47]
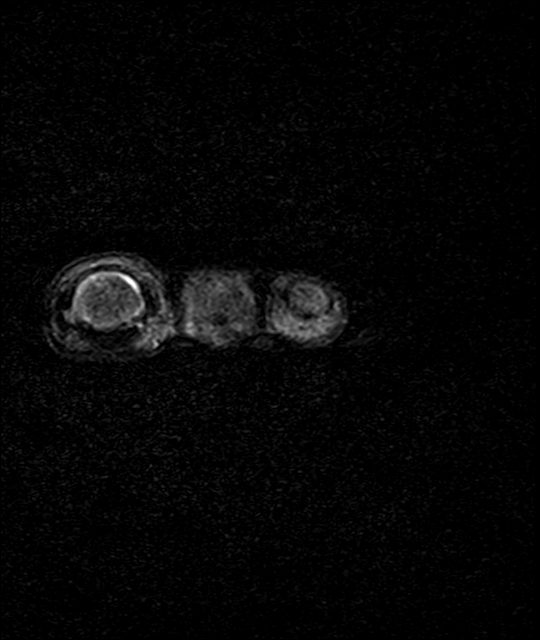
[im 19/47]
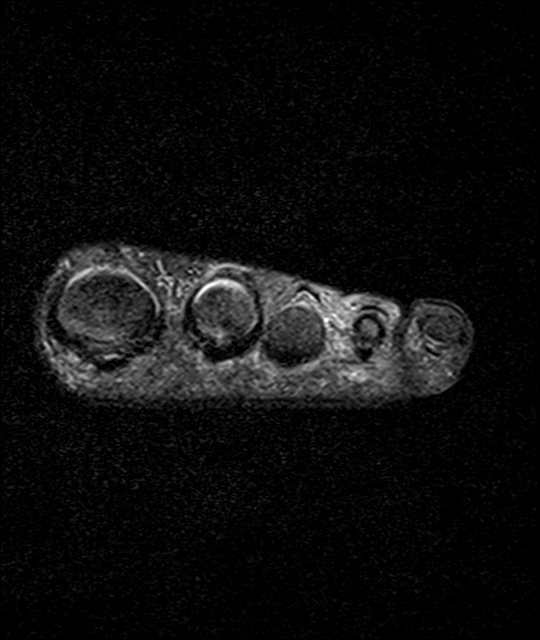
[im 28/47]
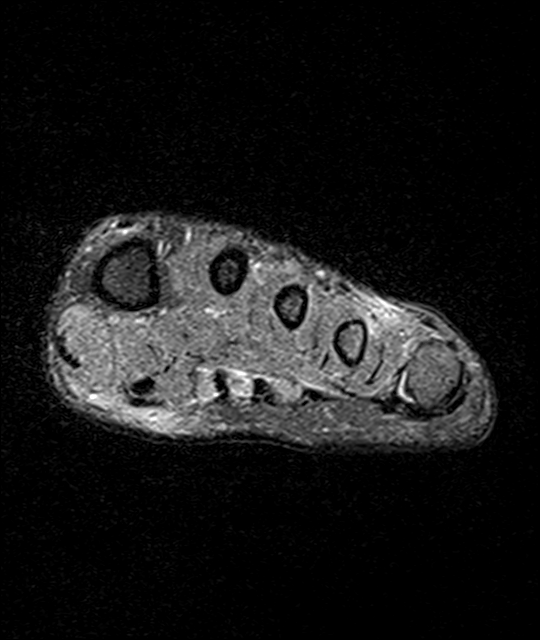
[im 37/47]
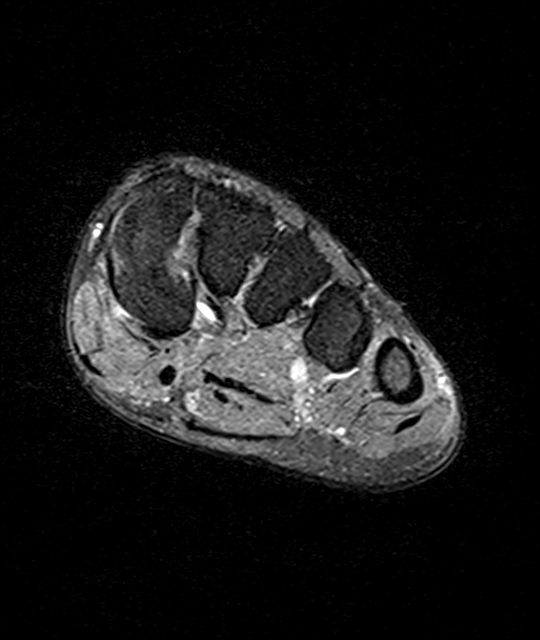
[im 47/47]
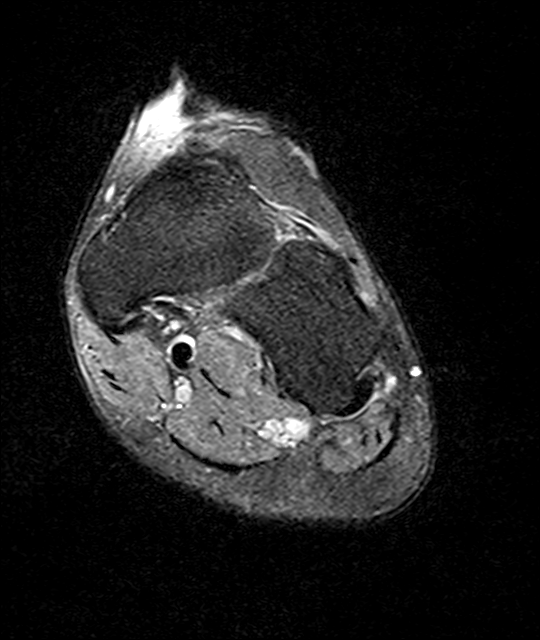

[Series 6: T1 · coronal · 3.0mm · 0.19mm/px · 4 of 47 slices shown]
[im 1/47]
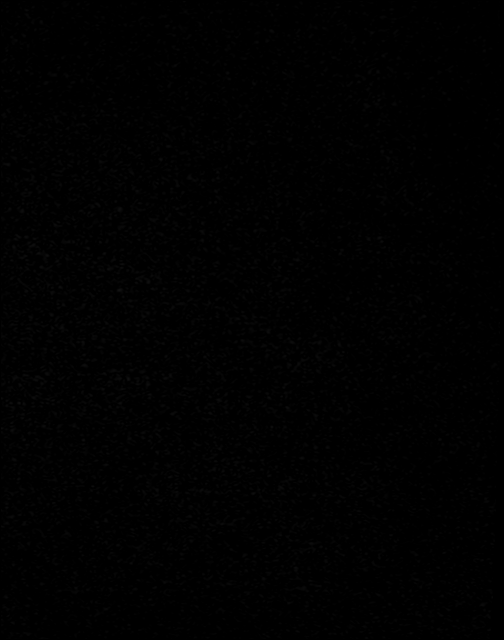
[im 10/47]
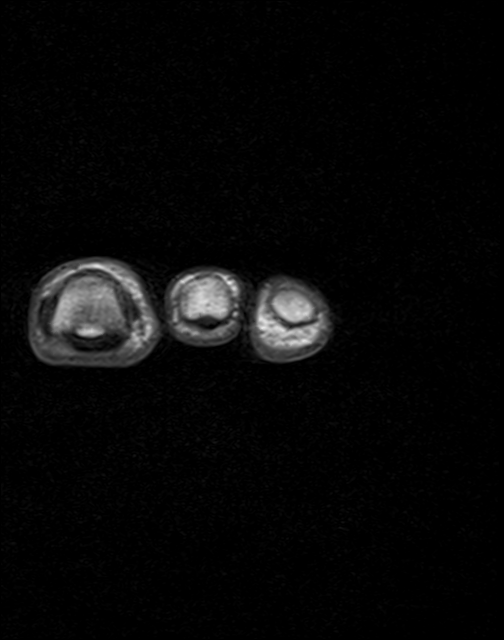
[im 28/47]
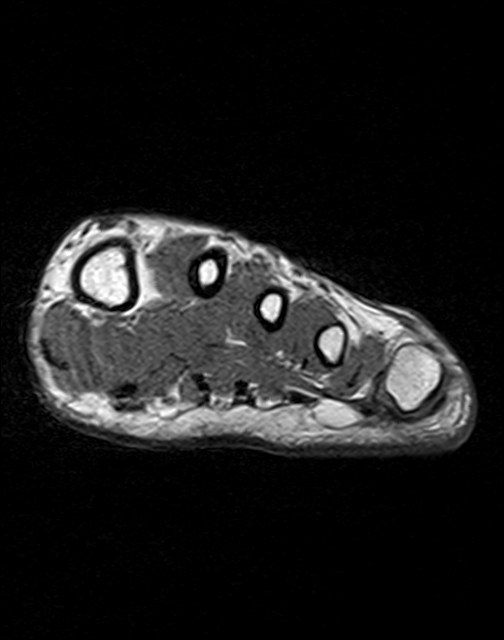
[im 47/47]
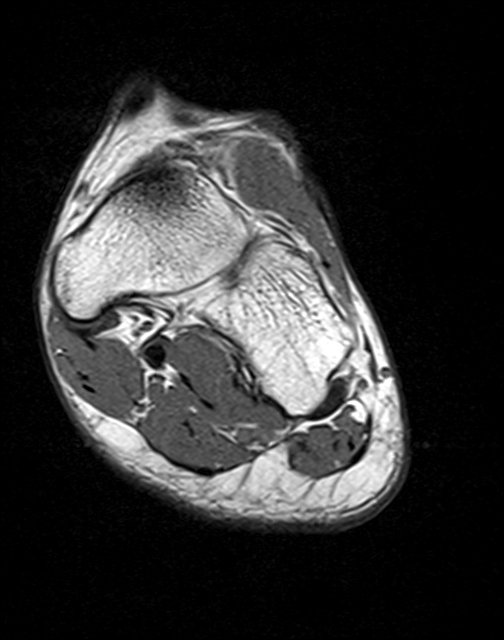

[Series 7: T2 fat-sat · axial · 3.0mm · 0.35mm/px · z∈[-128,-47]mm · 3 of 22 slices shown (2 of 2)]
[im 1/22]
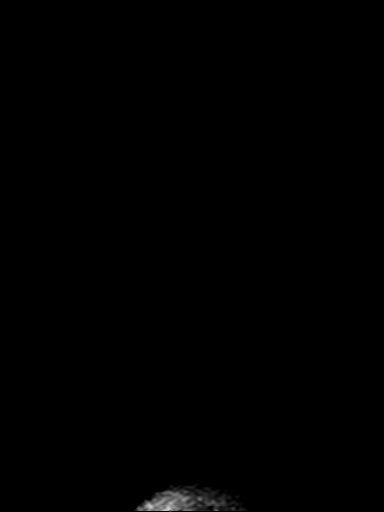
[im 11/22]
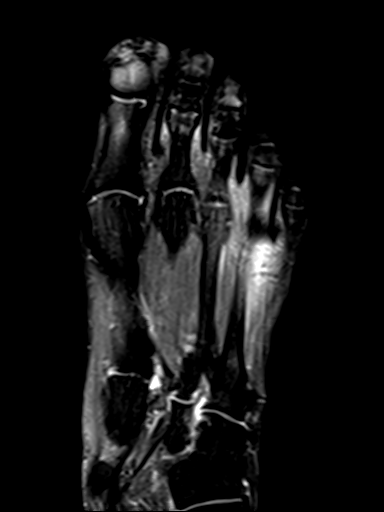
[im 22/22]
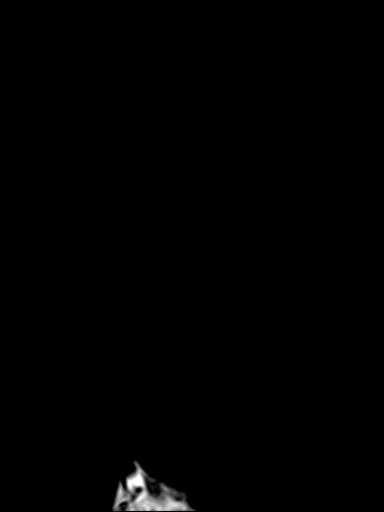

[Series 10: PD · sagittal · 3.0mm · 0.34mm/px · 3 of 28 slices shown]
[im 1/28]
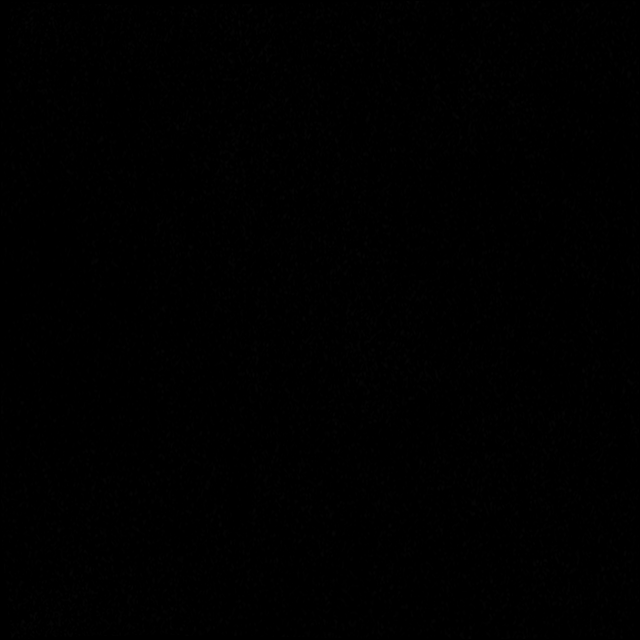
[im 14/28]
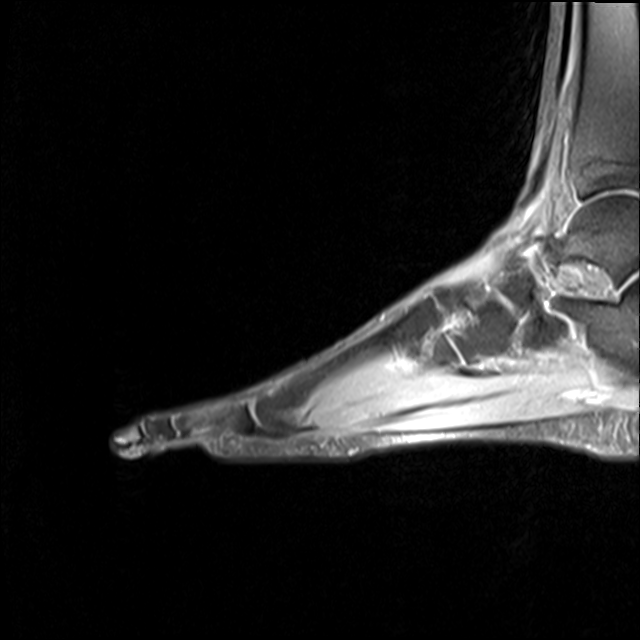
[im 28/28]
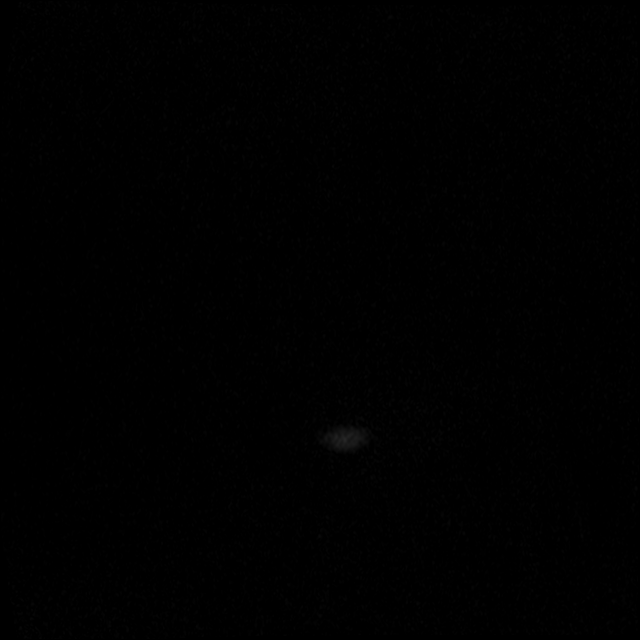

[16 of 40 positions shown; findings below may reference images not displayed]

FINDINGS: Bones/Joint/Cartilage

No evidence of acute fracture, dislocation or significant
arthropathy. There is no abnormal bone marrow signal or enhancement.
No erosive changes or abnormal synovial enhancement.

Ligaments

The Lisfranc ligament is intact. The collateral ligaments of the
metatarsophalangeal joints appear intact.

Muscles and Tendons

Unremarkable.  No tenosynovitis or abnormal enhancement.

Soft tissues

Capsules were placed over the palpable concern dorsal to the 4th and
5th metatarsals. No underlying soft tissue mass, fluid collection or
abnormal enhancement identified. There is some artifact along the
dorsal aspect of the 4th web space attributed to the capsules. Trace
intermetatarsal bursal fluid in the 2nd and 3rd webspaces is within
physiologic limits.
IMPRESSION: 1. No acute findings or explanation for the patient's symptoms.
2. No evidence of soft tissue mass or abnormal enhancement laterally
in the forefoot. Clinical follow up of any palpable concern
recommended.

## 2020-12-08 ENCOUNTER — Ambulatory Visit: Payer: Managed Care, Other (non HMO) | Admitting: Family Medicine

## 2020-12-20 ENCOUNTER — Ambulatory Visit: Payer: Managed Care, Other (non HMO)

## 2021-01-24 ENCOUNTER — Other Ambulatory Visit: Payer: Self-pay

## 2021-01-24 ENCOUNTER — Ambulatory Visit: Payer: Managed Care, Other (non HMO) | Attending: Family Medicine

## 2021-01-24 DIAGNOSIS — M79672 Pain in left foot: Secondary | ICD-10-CM | POA: Diagnosis not present

## 2021-01-24 NOTE — Therapy (Signed)
[facility] [address] [place], [place], [address] Phone: [phone]   Fax:  [phone]  Physical Therapy Evaluation  Patient Details  Name: Sara Webb MRN: [id number] Date of Birth: [birth year] Referring Provider (PT): Judi Saa, DO   Encounter Date: 01/24/2021   PT End of Session - 01/24/21 1718    Visit Number 1    Number of Visits 6    Date for PT Re-Evaluation 03/07/21    Authorization Type [employer]- 60VL per calendar year    PT Start Time 1625    PT Stop Time 1715    PT Time Calculation (min) 50 min    Activity Tolerance Patient tolerated treatment well    Behavior During Therapy [facility] for tasks assessed/performed           Past Medical History:  Diagnosis Date  . Family history of adverse reaction to anesthesia    aunt  . migraine     Past Surgical History:  Procedure Laterality Date  . DILATION AND CURETTAGE OF UTERUS N/A 07/15/2017   Procedure: DILATATION AND CURETTAGE;  Surgeon: Hildred Laser, MD;  Location: [facility] ORS;  Service: Gynecology;  Laterality: N/A;  . LAPAROSCOPIC OVARIAN CYSTECTOMY Right 05/18/2016   Procedure: LAPAROSCOPIC OVARIAN CYSTECTOMY;  Surgeon: Nadara Mustard, MD;  Location: [facility] ORS;  Service: Gynecology;  Laterality: Right;  . LAPAROSCOPIC TUBAL LIGATION Bilateral 07/15/2017   Procedure: LAPAROSCOPIC BILATERAL TUBAL LIGATION;  Surgeon: Hildred Laser, MD;  Location: [facility] ORS;  Service: Gynecology;  Laterality: Bilateral;  . NO PAST SURGERIES    . TUBAL LIGATION     Pt reports tubal ligation in 2017, 2018    There were no vitals filed for this visit.    Subjective Assessment - 01/24/21 1622    Subjective Pt reports primary c/o of dorsal L foot pain s/p injury in April of last year in which she dropped a dining table on top of the foot between the 4th and 5th metatarsals. This resulted in an acute hematoma/ bruise. She went to urgent care the same day and an X-ray showed no  fractures. She was instructed to elevate and ice her foot. She was also given crutches and a boot to wear for a week due to pain. She reports it took 1 month for the bruising to cease. 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 She received two cortisone shots between September and March of this year. She also had an MRI which was reviewed by her podiatrist. This again demonstrated no fracture of soft tissue injury according to the patient. She reports her foot aches more in the evening after being on her feet through the day. She is also very sensitive to anything bumping the top of her foot. She received an ultrasound before her 2nd cortisone shot and the physician noted he saw a neuroma between her 4th and 5th digits. He also noted that her intermetatarsal arch is low. She requests information regarding any orthotics that may help with this. She denies any numbness and tingling in her foot. She denies any night pain, nausea, vomiting, or unexpected weight loss.    Limitations Standing;Walking    How long can you sit comfortably? Unlimited    How long can you stand comfortably? 1.5-2 hours    How long can you walk comfortably? 15-20 minutes  Patient Stated Goals Walking dog, working as a Systems analystpersonal trainer, playing with children    Currently in Pain? 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  No    Is the patient reluctant to leave their home because of a fear of falling?  No      Home Nurse, mental healthnvironment   Living Environment Private residence    Living Arrangements Spouse/significant other;Children    Available Help at Discharge Family    Type of Home House    Home Access Stairs to enter    Entrance Stairs-Number of Steps 3    Entrance Stairs-Rails None    Home Layout One level    Home Equipment Crutches      Prior Function   Level of Independence Independent    Vocation Full time employment    EcologistVocation Requirements Personal trainer, stay-at-home mom    Leisure Exercise, hike, walk      Cognition   Overall Cognitive Status Within Functional Limits for tasks assessed      Observation/Other Assessments   Observations BIL pes planus L>R; BIL intermetatarsa arch depression upon weight-bearing; reddish coloration along line between 4th and 5th metatarsals on the L corresponding with pt's pain pattern    Focus on Therapeutic Outcomes (FOTO)  64% functional ability      AROM   Right Ankle Dorsiflexion --   WNL   Right Ankle Plantar Flexion --   WNL   Right Ankle Inversion 42    Right Ankle Eversion --   WNL   Left Ankle Dorsiflexion --   WNL   Left Ankle Plantar Flexion --   WNL   Left Ankle Inversion 35    Left Ankle Eversion --   WNL     PROM   Right Ankle Dorsiflexion --   WNL   Right Ankle Plantar Flexion --   WNL   Right Ankle Inversion 50    Right Ankle  Eversion --   WNL   Left Ankle Dorsiflexion --   WNL   Left Ankle Plantar Flexion --   WNL   Left Ankle Inversion 55    Left Ankle Eversion WNL  Strength   Right Hip Flexion 5/5    Left Hip Flexion 4/5    Right Knee Flexion 5/5    Right Knee Extension 5/5    Left Knee Flexion 5/5    Left Knee Extension 5/5    Right Ankle Dorsiflexion 5/5    Right Ankle Plantar Flexion 5/5    Right Ankle Inversion 5/5    Right Ankle Eversion 5/5    Left Ankle Dorsiflexion 5/5    Left Ankle Plantar Flexion 5/5    Left Ankle Inversion 5/5    Left Ankle Eversion 5/5      Flexibility   Soft Tissue Assessment /Muscle Length --   Gastroc and soleus WNL BIL     Palpation   Palpation comment TTP to dorsum of foot between 4th and 5th metatarsals on L      Anterior Drawer Test   Findings Negative    Side  --   BIL     Morton's Test    Findings Positive    Side Right                      Objective measurements completed on examination: See above findings.               PT Education - 01/24/21 1717    Education Details Upon request for recommended footwear, the PT recommended tennis shoes with a low to neutral heel drop, a metatarsal bar, and a wide toe box to assist with symptoms of Morton's neuroma. She was also educated on newly added HEP exercises.    Person(s) Educated Patient    Methods Explanation;Demonstration;Verbal cues;Handout    Comprehension Verbal cues required;Returned demonstration;Verbalized understanding            PT Short Term Goals - 01/24/21 1731      PT SHORT TERM GOAL #1   Title Pt will demonstrate understanding and adherence of HEP in order to independently manage her symptoms and target her primary impairments.    Baseline HEP given at initial evaluation.    Time 2    Period Weeks    Status New    Target Date 02/07/21             PT Long Term Goals - 01/24/21 1733      PT LONG TERM GOAL #1   Title Pt will demonstrate FOTO  score improvement to 75% in order to show improvement of functional ability in regard to her L foot pain.    Baseline 64%    Time 6    Period Weeks    Status New    Target Date 03/07/21      PT LONG TERM GOAL #2   Title Pt will report ability to walk for >30 minutes with 0/10 pain in order to walk her dog without limitation.    Baseline Unable to walk> 20 minutes without pain.    Time 6    Period Weeks    Status New    Target Date 03/07/21      PT LONG TERM GOAL #3   Title Pt will report ability to stand for 2 hours with 0/10 pain in order to work as a Systems analyst without limitation.    Baseline Unable to stand longer than 1.5-2 hours without pain.    Time 6    Period Weeks    Status New    Target Date 03/07/21      PT LONG TERM GOAL #  4   Title Pt will demonstrate no TTP to the dorsum of her L foot in order to not be in increased pain with contact to her area when playing with her children.    Baseline Pt has increased pain with contact to the dorsum of her L foot.    Time 6    Period Weeks    Status New    Target Date 03/07/21                  Plan - 01/24/21 1724    Clinical Impression Statement Pt is a pleasant 30yo F presenting with primary c/o L dorsal foot pain following an acute injury 1 year ago in which she dropped a chair on her foot. Upon assessment, her primary impairments include TTP to the L dorsum of foot between 4th and 5th metatarsals, limited L ankle inversion ROM compared to the R, pain with weight bearing on the L foot, positive Morton's squeeze testing on the L, BIL pes planus, and BIL intermetatarsal arch depression with weight-bearing. Her presentation and subjective report are most indicative of traumatic Morton's neuroma. Exercises targeting increase the pain threshhold by utilizing gait control theory may be beneficial moving forward. Intrinsic foot muscle strengthening exercises were introduced today, along with self-STM to the affected area.  She will benefit from skilled PT to address her primary impairments and help her return to her prior level of function without limitation due to pain.    Examination-Activity Limitations Locomotion Level;Stand    Examination-Participation Restrictions Occupation;Other   playing with kids, walking dog   Stability/Clinical Decision Making Stable/Uncomplicated    Clinical Decision Making Low    Rehab Potential Good    PT Frequency 1x / week    PT Duration --   4-6 weeks   PT Treatment/Interventions ADLs/Self Care Home Management;Cryotherapy;Electrical Stimulation;Moist Heat;Therapeutic activities;Therapeutic exercise;Balance training;Neuromuscular re-education;Manual techniques;Joint Manipulations;Dry needling;Passive range of motion;Patient/family education;Taping    PT Next Visit Plan Assess single-leg balance, progress foot intrinsic exercises, and consider adding modalities such as TENS for pain reduction    PT Home Exercise Plan [id number]    Consulted and Agree with Plan of Care Patient           Patient will benefit from skilled therapeutic intervention in order to improve the following deficits and impairments:  Decreased range of motion  Visit Diagnosis: Pain in left foot - Plan: PT plan of care cert/re-cert     Problem List Patient Active Problem List   Diagnosis Date Noted  . Right knee pain 10/25/2020  . Left foot pain 09/13/2020  . Cervicogenic headache 09/13/2020  . Nonallopathic lesion of cervical region 09/13/2020  . Anxiety 04/20/2019  . Endometriosis 07/15/2017  . Postpartum anemia 04/10/2017  . Right ovarian cyst 05/18/2016    Carmelina Dane, PT, DPT 01/24/21 5:40 PM   [facility] [address] [place], [place], [address] Phone: [phone]   Fax:  [phone]  Name: KYAIRA TRANTHAM MRN: [id number] Date of Birth: [birth year]

## 2021-01-24 NOTE — Patient Instructions (Addendum)
[id number]

## 2021-02-01 ENCOUNTER — Ambulatory Visit: Payer: Managed Care, Other (non HMO) | Admitting: Podiatry

## 2021-02-01 ENCOUNTER — Encounter: Payer: Self-pay | Admitting: Podiatry

## 2021-02-01 ENCOUNTER — Other Ambulatory Visit: Payer: Self-pay

## 2021-02-01 DIAGNOSIS — G5782 Other specified mononeuropathies of left lower limb: Secondary | ICD-10-CM

## 2021-02-01 NOTE — Progress Notes (Signed)
She presents today for follow-up of her painful fourth intermetatarsal space left foot.  States that is been over a year now that they dropped a chair on her left foot resulting in pain of a chronic nature left.  She has been to see orthopedics twice and even physical therapy to no avail.  She is received a ultrasound-guided injection for a neuroma fourth interdigital space.  Objective: Vital signs are stable alert and oriented x3.  Pulses are palpable.  No erythema edema cellulitis drainage or odor she has pain on palpation to fourth intermetatarsal space left.  MRI was negative for mass  Plan: Discussed etiology pathology conservative surgical therapies at this point surgical approach could be taken and she consented for this today primary diagnosis is going to be neuroma with excision of that from the fourth intermetatarsal space left foot.  We discussed the possible postop complications which may include but are not limited to postop pain bleeding swelling infection recurrence need for further surgery overcorrection under correction loss of digit loss of limb loss of life chronic pain syndrome.  She was provided with information regarding the surgery center today information regarding anesthesia group and provided a cam walker for postop recovery I will follow-up with her in the fall for surgical intervention.

## 2021-03-16 ENCOUNTER — Encounter: Payer: Managed Care, Other (non HMO) | Admitting: Certified Nurse Midwife

## 2021-04-05 ENCOUNTER — Telehealth: Payer: Self-pay | Admitting: Urology

## 2021-04-05 NOTE — Telephone Encounter (Signed)
DOS - 04/25/21  NEURECTOMY 4TH LEFT --- 76195  [employer] EFFECTIVE DATE - 11/19/15  PER [employer]'S AUTOMATIVE SYSTEM FOR CPT CODE 09326 NO PRIOR AUTH IS REQUIRED.  REF # [id number]

## 2021-05-04 ENCOUNTER — Encounter: Payer: Managed Care, Other (non HMO) | Admitting: Podiatry

## 2021-05-10 ENCOUNTER — Encounter: Payer: Managed Care, Other (non HMO) | Admitting: Podiatry

## 2021-05-17 ENCOUNTER — Encounter: Payer: Managed Care, Other (non HMO) | Admitting: Podiatry

## 2021-05-17 ENCOUNTER — Other Ambulatory Visit: Payer: Self-pay | Admitting: Podiatry

## 2021-05-17 MED ORDER — CEPHALEXIN 500 MG PO CAPS: 500.0000 mg | ORAL_CAPSULE | Freq: Three times a day (TID) | ORAL | 0 refills | Status: DC

## 2021-05-17 MED ORDER — OXYCODONE-ACETAMINOPHEN 10-325 MG PO TABS: 1.0000 | ORAL_TABLET | Freq: Three times a day (TID) | ORAL | 0 refills | Status: AC | PRN

## 2021-05-17 MED ORDER — ONDANSETRON HCL 4 MG PO TABS: 4.0000 mg | ORAL_TABLET | Freq: Three times a day (TID) | ORAL | 0 refills | Status: DC | PRN

## 2021-05-19 DIAGNOSIS — G5762 Lesion of plantar nerve, left lower limb: Secondary | ICD-10-CM | POA: Diagnosis not present

## 2021-05-24 ENCOUNTER — Other Ambulatory Visit: Payer: Self-pay

## 2021-05-24 ENCOUNTER — Encounter: Payer: Managed Care, Other (non HMO) | Admitting: Podiatry

## 2021-05-24 ENCOUNTER — Ambulatory Visit (INDEPENDENT_AMBULATORY_CARE_PROVIDER_SITE_OTHER): Payer: Managed Care, Other (non HMO) | Admitting: Podiatry

## 2021-05-24 ENCOUNTER — Other Ambulatory Visit: Payer: Self-pay | Admitting: Podiatry

## 2021-05-24 ENCOUNTER — Encounter: Payer: Self-pay | Admitting: Podiatry

## 2021-05-24 DIAGNOSIS — G5782 Other specified mononeuropathies of left lower limb: Secondary | ICD-10-CM

## 2021-05-24 DIAGNOSIS — M7989 Other specified soft tissue disorders: Secondary | ICD-10-CM

## 2021-05-24 DIAGNOSIS — Z9889 Other specified postprocedural states: Secondary | ICD-10-CM

## 2021-05-24 MED ORDER — TRAMADOL HCL 50 MG PO TABS: 50.0000 mg | ORAL_TABLET | Freq: Three times a day (TID) | ORAL | 0 refills | Status: DC | PRN

## 2021-05-24 NOTE — Progress Notes (Signed)
She presents today for first postop visit she is status post neurectomy fourth interdigital space left foot.  States that she was doing fine using crutches and then had a syncopal Eco episode Saturday after having stepped on the foot and experienced some discomfort.  She states that she has been taking ibuprofen as subnarcotic.  Objective: Vital signs are stable alert oriented x3.  Pulses are palpable.  There is no erythema edema cellulitis drainage or odor incision site appears to be healing very nicely.  Assessment: Well-healing surgical foot.  Plan: Redressed today dressed a compressive dressing follow-up with Dr. Lilian Kapur in 1 week sutures may be removed at that time if coaptation is present.

## 2021-05-24 NOTE — Progress Notes (Unsigned)
tram

## 2021-06-01 ENCOUNTER — Other Ambulatory Visit: Payer: Self-pay

## 2021-06-01 ENCOUNTER — Ambulatory Visit (INDEPENDENT_AMBULATORY_CARE_PROVIDER_SITE_OTHER): Payer: Managed Care, Other (non HMO) | Admitting: Podiatry

## 2021-06-01 DIAGNOSIS — G5782 Other specified mononeuropathies of left lower limb: Secondary | ICD-10-CM

## 2021-06-01 DIAGNOSIS — M7989 Other specified soft tissue disorders: Secondary | ICD-10-CM

## 2021-06-01 DIAGNOSIS — Z9889 Other specified postprocedural states: Secondary | ICD-10-CM

## 2021-06-05 ENCOUNTER — Other Ambulatory Visit: Payer: Self-pay | Admitting: Podiatry

## 2021-06-06 NOTE — Progress Notes (Signed)
She presents today for first postop visit she is status post neurectomy fourth interdigital space left foot.  She states that she is doing well using crutches and surgical shoe.  She has some discomfort overall doing much better.  She is ready get the stitches out.  Objective: Vital signs are stable alert oriented x3.  Pulses are palpable.  There is no erythema edema cellulitis drainage or odor incision site.  Superficial dehiscence noted.  No erythema or clinical signs of infection noted  Assessment: Well-healing surgical foot.  Plan: Stitches was removed.  Superficial dehiscence noted.  I encouraged patient to keep it covered with some Neosporin and a Band-Aid until skin completely epithelialized.  Patient states understanding

## 2021-06-06 NOTE — Patient Instructions (Signed)
The

## 2021-06-07 ENCOUNTER — Encounter: Payer: Managed Care, Other (non HMO) | Admitting: Podiatry

## 2021-06-14 ENCOUNTER — Ambulatory Visit (INDEPENDENT_AMBULATORY_CARE_PROVIDER_SITE_OTHER): Payer: Managed Care, Other (non HMO) | Admitting: Podiatry

## 2021-06-14 ENCOUNTER — Encounter: Payer: Self-pay | Admitting: Podiatry

## 2021-06-14 ENCOUNTER — Other Ambulatory Visit: Payer: Self-pay

## 2021-06-14 DIAGNOSIS — G5782 Other specified mononeuropathies of left lower limb: Secondary | ICD-10-CM

## 2021-06-14 DIAGNOSIS — Z9889 Other specified postprocedural states: Secondary | ICD-10-CM

## 2021-06-14 DIAGNOSIS — M7989 Other specified soft tissue disorders: Secondary | ICD-10-CM

## 2021-06-14 NOTE — Progress Notes (Signed)
She presents today for follow-up of her surgical foot left excision of a neuroma fourth interdigital space left foot.  She states that is been feeling pretty good.  Objective: Vital signs are stable she is alert and oriented x3 incision site is going on to heal uneventfully there is no erythema cellulitis drainage or odor.  Assessment: Well-healing surgical foot.  Plan: Place Steri-Strips today I will allow her to get back to her regular shoes over the next 2 weeks I will follow-up with her at that time.

## 2021-06-21 ENCOUNTER — Encounter: Payer: Managed Care, Other (non HMO) | Admitting: Podiatry

## 2021-06-28 ENCOUNTER — Other Ambulatory Visit: Payer: Self-pay

## 2021-06-28 ENCOUNTER — Encounter: Payer: Self-pay | Admitting: Podiatry

## 2021-06-28 ENCOUNTER — Ambulatory Visit (INDEPENDENT_AMBULATORY_CARE_PROVIDER_SITE_OTHER): Payer: Managed Care, Other (non HMO) | Admitting: Podiatry

## 2021-06-28 DIAGNOSIS — Z9889 Other specified postprocedural states: Secondary | ICD-10-CM

## 2021-06-28 DIAGNOSIS — M7989 Other specified soft tissue disorders: Secondary | ICD-10-CM

## 2021-06-28 DIAGNOSIS — G5782 Other specified mononeuropathies of left lower limb: Secondary | ICD-10-CM

## 2021-06-28 NOTE — Progress Notes (Signed)
She presents today for postop visit date of surgery 05/19/2021 excision of nerve with reimplantation of the nerve and release of the fourth intermetatarsal ligament left foot.  She denies fever chills nausea vomiting muscle aches pain states that is doing quite well the Darco shoe was aggravating her so she continues to wear her cam boot.  Objective: Vital signs are stable she is alert and oriented x3 there is no erythema edema cellulitis drainage or odor Steri-Strips are in place I went ahead and remove those today she still has some scar tissue deep that is restricting the motion of the skin to some degree but all in all she is doing much much better and improving considerably.  Assessment: Well-healing surgical foot left.  Plan: Encouraged range of motion exercises of the toes also encourage massage therapy of the incision to help break down some of the scar tissue encouraged some of the topical uses of bio oil and vitamin E oil to help with the scar.  Also discussed the possible need for contrast baths.  Follow-up with me in 1 month try regular shoe gear.

## 2021-08-02 ENCOUNTER — Encounter: Payer: Managed Care, Other (non HMO) | Admitting: Podiatry

## 2021-08-09 ENCOUNTER — Encounter: Payer: Managed Care, Other (non HMO) | Admitting: Podiatry

## 2022-03-06 NOTE — Progress Notes (Unsigned)
[facility] [address] [place] [address] Phone: [phone] Subjective:   Bruce Donath, am serving as a scribe for Dr. Antoine Primas.  I'm seeing this patient by the request  of:  Kandyce Rud, MD  CC: Right shoulder, ongoing pain  [id number]  Last seen March 2022 for foot and knee pain.  Sara Webb is a 31 y.o. female coming in with complaint of right shoulder and R glute pain. Pulled glute a few weeks ago. Karna Christmas for massage. Tenderness near top of posterior hip. Pain has been improving. Able to exercise with modifications.   At beach last week with children and was trying to carry 2 boys in the sand and she developed shoulder pain the following morning. Pain with IR. No history of shoulder pain. Pain is mostly in posterior aspect of shoulder. Pain is achy in nature and worse with lifting weights.        Past Medical History:  Diagnosis Date   Family history of adverse reaction to anesthesia    aunt   migraine    Past Surgical History:  Procedure Laterality Date   DILATION AND CURETTAGE OF UTERUS N/A 07/15/2017   Procedure: DILATATION AND CURETTAGE;  Surgeon: Hildred Laser, MD;  Location: [facility] ORS;  Service: Gynecology;  Laterality: N/A;   LAPAROSCOPIC OVARIAN CYSTECTOMY Right 05/18/2016   Procedure: LAPAROSCOPIC OVARIAN CYSTECTOMY;  Surgeon: Nadara Mustard, MD;  Location: [facility];  Service: Gynecology;  Laterality: Right;   LAPAROSCOPIC TUBAL LIGATION Bilateral 07/15/2017   Procedure: LAPAROSCOPIC BILATERAL TUBAL LIGATION;  Surgeon: Hildred Laser, MD;  Location: [facility] OR[facility];  Service: Gynecology;  Laterality: Bilateral;   NO PAST SURGERIES     TUBAL LIGATION     Pt reports tubal ligation in 2017, 2018   Social History   Socioeconomic History   Marital status: Married    Spouse name: Not on file   Number of children: Not on file   Years of education: Not on file   Highest education level: Not on file   Occupational History   Not on file  Tobacco Use   Smoking status: Never   Smokeless tobacco: Never  Vaping Use   Vaping Use: Never used  Substance and Sexual Activity   Alcohol use: No   Drug use: No   Sexual activity: Yes    Birth control/protection: None, Surgical    Comment: tubal ligation  Other Topics Concern   Not on file  Social History Narrative   Not on file   Social Determinants of Health   Financial Resource Strain: Not on file  Food Insecurity: Not on file  Transportation Needs: Not on file  Physical Activity: Not on file  Stress: Not on file  Social Connections: Not on file   Allergies  Allergen Reactions   Sulfa Antibiotics Other (See Comments)    Childhood allergy   Family History  Problem Relation Age of Onset   Cancer Paternal Aunt        melanoma      Current Outpatient Medications (Respiratory):    fluticasone (FLONASE) 50 MCG/ACT nasal spray, SHAKE LIQUID AND USE 1 SPRAY IN EACH NOSTRIL TWICE DAILY   loratadine (CLARITIN) 10 MG tablet, Take 10 mg by mouth daily.  Current Outpatient Medications (Analgesics):    acetaminophen (TYLENOL) 325 MG tablet, Take 325-650 mg every 6 (six) hours as needed by mouth for moderate pain or headache.   traMADol (ULTRAM) 50 MG tablet, TAKE 1 TABLET(50  MG) BY MOUTH EVERY 8 HOURS FOR UP TO 5 DAYS AS NEEDED   Current Outpatient Medications (Other):    clonazePAM (KLONOPIN) 0.5 MG tablet, Take 0.5 mg by mouth daily as needed.   hydrOXYzine (ATARAX/VISTARIL) 25 MG tablet, Take 25 mg by mouth 3 (three) times daily.   Multiple Vitamin (MULTI-VITAMINS) TABS, Take 1 tablet daily by mouth.    sertraline (ZOLOFT) 100 MG tablet, Take 100 mg by mouth daily.   Reviewed prior external information including notes and imaging from  primary care provider As well as notes that were available from care everywhere and other healthcare systems.  Past medical history, social, surgical and family history all reviewed in  electronic medical record.  No pertanent information unless stated regarding to the chief complaint.   Review of Systems:  No headache, visual changes, nausea, vomiting, diarrhea, constipation, dizziness, abdominal pain, skin rash, fevers, chills, night sweats, weight loss, swollen lymph nodes, body aches, joint swelling, chest pain, shortness of breath, mood changes. POSITIVE muscle aches  Objective  Blood pressure 102/68, pulse 93, height 5\' 7"  (1.702 m), weight 174 lb (78.9 kg), SpO2 98 %.   General: No apparent distress alert and oriented x3 mood and affect normal, dressed appropriately.  HEENT: Pupils equal, extraocular movements intact  Respiratory: Patient's speak in full sentences and does not appear short of breath  Cardiovascular: No lower extremity edema, non tender, no erythema  Right shoulder exam shows some positive impingement noted.  Negative straight arm test.  Negative crossover.  More tenderness in the scapular region. Osteopathic findings C2 flexed rotated and side bent right C4 flexed rotated and side bent left C6 flexed rotated and side bent left T3 extended rotated and side bent right inhaled third rib T9 extended rotated and side bent right L2 flexed rotated and side bent right Sacrum right on right  Cervicogenic headache Patient did have more of a tightness of the upper back.  I think that is contributing to more of the shoulder pain itself.  We discussed with patient about icing regimen and home exercises.  Patient did respond extremely well though to osteopathic manipulation.  No changes in medications at the moment.  This is a chronic issues with some worsening symptoms.  Follow-up again in 6 to 8 weeks  Right shoulder pain Appears to be more of a suppurative syndrome.  Ultrasound today patient shoulder did not show anything specific except for some mild calcific changes noted of the posterior labrum.  If anything this continues to give significant difficulty we  will consider further treatment such as physical therapy or the possibility of injections.  Follow-up again in 6 to 8 weeks.     Decision today to treat with OMT was based on Physical Exam  After verbal consent patient was treated with HVLA, ME, FPR techniques in cervical, thoracic, rib, lumbar and sacral areas, all areas are chronic   Patient tolerated the procedure well with improvement in symptoms  Patient given exercises, stretches and lifestyle modifications  See medications in patient instructions if given  Patient will follow up in 4-8 weeks    Impression and Recommendations:     The above documentation has been reviewed and is accurate and complete , DO

## 2022-03-07 ENCOUNTER — Ambulatory Visit: Payer: Managed Care, Other (non HMO) | Admitting: Family Medicine

## 2022-03-07 ENCOUNTER — Ambulatory Visit: Payer: Self-pay

## 2022-03-07 VITALS — BP 102/68 | HR 93 | Ht 67.0 in | Wt 174.0 lb

## 2022-03-07 DIAGNOSIS — M9901 Segmental and somatic dysfunction of cervical region: Secondary | ICD-10-CM

## 2022-03-07 DIAGNOSIS — G4486 Cervicogenic headache: Secondary | ICD-10-CM

## 2022-03-07 DIAGNOSIS — M9904 Segmental and somatic dysfunction of sacral region: Secondary | ICD-10-CM | POA: Diagnosis not present

## 2022-03-07 DIAGNOSIS — M9908 Segmental and somatic dysfunction of rib cage: Secondary | ICD-10-CM | POA: Diagnosis not present

## 2022-03-07 DIAGNOSIS — M9903 Segmental and somatic dysfunction of lumbar region: Secondary | ICD-10-CM

## 2022-03-07 DIAGNOSIS — M25511 Pain in right shoulder: Secondary | ICD-10-CM | POA: Diagnosis not present

## 2022-03-07 DIAGNOSIS — M9902 Segmental and somatic dysfunction of thoracic region: Secondary | ICD-10-CM

## 2022-03-07 NOTE — Patient Instructions (Signed)
Good to see you! Do prescribed exercises at least 3x a week  Vit D 2000-4000iu Tart Cherry 1200mg  daily See you again in 6-7 weeks

## 2022-03-07 NOTE — Assessment & Plan Note (Signed)
Appears to be more of a suppurative syndrome.  Ultrasound today patient shoulder did not show anything specific except for some mild calcific changes noted of the posterior labrum.  If anything this continues to give significant difficulty we will consider further treatment such as physical therapy or the possibility of injections.  Follow-up again in 6 to 8 weeks.

## 2022-03-07 NOTE — Assessment & Plan Note (Signed)
Patient did have more of a tightness of the upper back.  I think that is contributing to more of the shoulder pain itself.  We discussed with patient about icing regimen and home exercises.  Patient did respond extremely well though to osteopathic manipulation.  No changes in medications at the moment.  This is a chronic issues with some worsening symptoms.  Follow-up again in 6 to 8 weeks

## 2022-04-16 NOTE — Progress Notes (Unsigned)
  [facility] [address] [place] [address] Phone: [phone] Subjective:   INadine Webb, am serving as a scribe for Dr. Antoine Primas.  I'm seeing this patient by the request  of:  Kandyce Rud, MD  CC: Neck and shoulder pain follow-up  HUT:[id number]  Sara Webb is a 31 y.o. female coming in with complaint of back and neck pain. OMT 03/07/2022.  Patient states shoulder is doing a lot better. Exercises are helping. Only minor discomfort when exercising and just goes a little lighter to help.  Medications patient has been prescribed: None  Taking:         Past Medical History:  Diagnosis Date   Family history of adverse reaction to anesthesia    aunt   migraine     Allergies  Allergen Reactions   Sulfa Antibiotics Other (See Comments)    Childhood allergy     Objective  Pulse 65, height 5\' 7"  (1.702 m), weight 168 lb (76.2 kg), SpO2 98 %.   General: No apparent distress alert and oriented x3 mood and affect normal, dressed appropriately.  HEENT: Pupils equal, extraocular movements intact  Respiratory: Patient's speak in full sentences and does not appear short of breath  Cardiovascular: No lower extremity edema, non tender, no erythema  Gait normal  MSK:  Back does have some loss of lordosis.  Some tenderness to palpation of the paraspinal musculature.  Patient does have more scapular dyskinesis right greater than left.  Osteopathic findings  C2 flexed rotated and side bent right C6 flexed rotated and side bent right T3 extended rotated and side bent right inhaled rib T9 extended rotated and side bent right L2 flexed rotated and side bent right Sacrum right on right       Assessment and Plan:  Cervicogenic headache Continue with cervicogenic headaches.  Has been responding well to osteopathic manipulation.  I do believe that the scapular dysfunction causes more of the discomfort in the neck as well as the  shoulder.  As long as patient does the exercises she will continue to improve follow-up again in 6 to 8 weeks.    Nonallopathic problems  Decision today to treat with OMT was based on Physical Exam  After verbal consent patient was treated with HVLA, ME, FPR techniques in cervical, rib, thoracic, lumbar, and sacral  areas  Patient tolerated the procedure well with improvement in symptoms  Patient given exercises, stretches and lifestyle modifications  See medications in patient instructions if given  Patient will follow up in 4-8 weeks    The above documentation has been reviewed and is accurate and complete , DO          Note: This dictation was prepared with Dragon dictation along with smaller phrase technology. Any transcriptional errors that result from this process are unintentional.

## 2022-04-17 ENCOUNTER — Ambulatory Visit: Payer: Managed Care, Other (non HMO) | Admitting: Family Medicine

## 2022-04-17 ENCOUNTER — Encounter: Payer: Self-pay | Admitting: Family Medicine

## 2022-04-17 ENCOUNTER — Ambulatory Visit: Payer: Self-pay

## 2022-04-17 VITALS — HR 65 | Ht 67.0 in | Wt 168.0 lb

## 2022-04-17 DIAGNOSIS — M9901 Segmental and somatic dysfunction of cervical region: Secondary | ICD-10-CM

## 2022-04-17 DIAGNOSIS — M25511 Pain in right shoulder: Secondary | ICD-10-CM | POA: Diagnosis not present

## 2022-04-17 DIAGNOSIS — M9904 Segmental and somatic dysfunction of sacral region: Secondary | ICD-10-CM

## 2022-04-17 DIAGNOSIS — M9903 Segmental and somatic dysfunction of lumbar region: Secondary | ICD-10-CM | POA: Diagnosis not present

## 2022-04-17 DIAGNOSIS — G4486 Cervicogenic headache: Secondary | ICD-10-CM | POA: Diagnosis not present

## 2022-04-17 DIAGNOSIS — M9908 Segmental and somatic dysfunction of rib cage: Secondary | ICD-10-CM | POA: Diagnosis not present

## 2022-04-17 DIAGNOSIS — M9902 Segmental and somatic dysfunction of thoracic region: Secondary | ICD-10-CM

## 2022-04-17 NOTE — Assessment & Plan Note (Signed)
Continue with cervicogenic headaches.  Has been responding well to osteopathic manipulation.  I do believe that the scapular dysfunction causes more of the discomfort in the neck as well as the shoulder.  As long as patient does the exercises she will continue to improve follow-up again in 6 to 8 weeks.

## 2022-04-17 NOTE — Patient Instructions (Signed)
Good to see you   

## 2022-06-18 ENCOUNTER — Ambulatory Visit: Payer: Managed Care, Other (non HMO)

## 2022-06-18 VITALS — BP 112/72 | HR 58 | Resp 15 | Ht 67.0 in | Wt 163.8 lb

## 2022-06-18 DIAGNOSIS — R350 Frequency of micturition: Secondary | ICD-10-CM

## 2022-06-18 LAB — POCT URINALYSIS DIPSTICK
Appearance: NORMAL
Bilirubin, UA: NEGATIVE
Blood, UA: NEGATIVE
Glucose, UA: NEGATIVE
Ketones, UA: NEGATIVE
Leukocytes, UA: NEGATIVE
Nitrite, UA: NEGATIVE
Odor: NEGATIVE
Protein, UA: NEGATIVE
Spec Grav, UA: 1.01 (ref 1.010–1.025)
Urobilinogen, UA: 0.2 E.U./dL
pH, UA: 6.5 (ref 5.0–8.0)

## 2022-06-18 NOTE — Progress Notes (Signed)
Subjective:    Sara Webb is a 31 y.o. female who complains of frequency, incomplete bladder emptying, and urgency for 2 days.  Patient also complains of  none . Patient denies back pain, congestion, cough, fever, headache, rhinitis, sorethroat, stomach ache, and vaginal discharge.  Patient does not have a history of recurrent UTI.  Patient does not have a history of pyelonephritis. The following portions of the patient's history were reviewed and updated as appropriate: allergies, current medications, and problem list. Review of Systems Pertinent items are noted in HPI.    Objective:    BP 112/72   Pulse (!) 58   Resp 15   Ht 5\' 7"  (1.702 m)   Wt 163 lb 12.8 oz (74.3 kg)   LMP 06/13/2022 (Exact Date)   BMI 25.65 kg/m  General: alert and cooperative            Laboratory:  Urine dipstick shows negative for all components.   Micro exam: not done.   Urinalysis    Component Value Date/Time   COLORURINE AMBER (A) 07/17/2017 1759   APPEARANCEUR TURBID (A) 07/17/2017 1759   APPEARANCEUR Clear 09/11/2016 1048   LABSPEC 1.015 07/17/2017 1759   PHURINE 6.0 07/17/2017 1759   GLUCOSEU NEGATIVE 07/17/2017 1759   HGBUR LARGE (A) 07/17/2017 1759   BILIRUBINUR negative 06/18/2022 1404   BILIRUBINUR Negative 09/11/2016 1048   KETONESUR NEGATIVE 07/17/2017 1759   PROTEINUR Negative 06/18/2022 1404   PROTEINUR 100 (A) 07/17/2017 1759   UROBILINOGEN 0.2 06/18/2022 1404   NITRITE negative 06/18/2022 1404   NITRITE POSITIVE (A) 07/17/2017 1759   LEUKOCYTESUR Negative 06/18/2022 1404   LEUKOCYTESUR Negative 09/11/2016 1048     Assessment:    Urinary Frequency    Plan: Plan:    1. Medications: not indicated at this time 2. Maintain adequate hydration 3. Follow up if symptoms not improving, and prn.

## 2022-06-18 NOTE — Patient Instructions (Signed)

## 2022-06-19 ENCOUNTER — Ambulatory Visit: Payer: Managed Care, Other (non HMO) | Admitting: Family Medicine

## 2022-06-20 ENCOUNTER — Encounter: Payer: Self-pay | Admitting: Certified Nurse Midwife

## 2022-06-20 LAB — URINE CULTURE

## 2022-07-19 ENCOUNTER — Ambulatory Visit: Payer: Managed Care, Other (non HMO) | Admitting: Licensed Practical Nurse

## 2022-08-27 NOTE — Progress Notes (Unsigned)
PCP:  Derinda Late, MD   No chief complaint on file.    HPI:      [name] is a 32 y.o. [id number] whose LMP was No LMP recorded. (Menstrual status: Other)., presents today for her annual examination.  Her menses are {norm/abn:715}, lasting {number: 22536} days.  Dysmenorrhea {dysmen:716}. She {does:18564} have intermenstrual bleeding.  Sex activity: single partner, contraception - tubal ligation.  Last Pap: 03/14/20 Results were:  unsatisfactory for eval  /neg HPV DNA. Repeat pap due after 4 months but not done Hx of STDs: {STD hx:14358}  There is no FH of breast cancer. There is no FH of ovarian cancer. The patient {does:18564} do self-breast exams.  Tobacco use: {tob:20664} Alcohol use: {Alcohol:11675} No drug use.  Exercise: {exercise:31265}  She {does:18564} get adequate calcium and Vitamin D in her diet.  Patient Active Problem List   Diagnosis Date Noted   Right shoulder pain 03/07/2022   Right knee pain 10/25/2020   Left foot pain 09/13/2020   Cervicogenic headache 09/13/2020   Nonallopathic lesion of cervical region 09/13/2020   Morton's neuroma of left foot 05/23/2020   Anxiety 04/20/2019   Endometriosis 07/15/2017   Postpartum anemia 04/10/2017   Right ovarian cyst 05/18/2016    Past Surgical History:  Procedure Laterality Date   DILATION AND CURETTAGE OF UTERUS N/A 07/15/2017   Procedure: DILATATION AND CURETTAGE;  Surgeon: Rubie Maid, MD;  Location: [facility];  Service: Gynecology;  Laterality: N/A;   LAPAROSCOPIC OVARIAN CYSTECTOMY Right 05/18/2016   Procedure: LAPAROSCOPIC OVARIAN CYSTECTOMY;  Surgeon: Gae Dry, MD;  Location: [facility];  Service: Gynecology;  Laterality: Right;   LAPAROSCOPIC TUBAL LIGATION Bilateral 07/15/2017   Procedure: LAPAROSCOPIC BILATERAL TUBAL LIGATION;  Surgeon: Rubie Maid, MD;  Location: [facility];  Service: Gynecology;  Laterality: Bilateral;   NO PAST SURGERIES     TUBAL LIGATION     Pt reports tubal  ligation in 2017, 2018    Family History  Problem Relation Age of Onset   Cancer Paternal Aunt        melanoma    Social History   Socioeconomic History   Marital status: Married    Spouse name: Not on file   Number of children: Not on file   Years of education: Not on file   Highest education level: Not on file  Occupational History   Not on file  Tobacco Use   Smoking status: Never   Smokeless tobacco: Never  Vaping Use   Vaping Use: Never used  Substance and Sexual Activity   Alcohol use: No   Drug use: No   Sexual activity: Yes    Birth control/protection: None, Surgical    Comment: tubal ligation  Other Topics Concern   Not on file  Social History Narrative   Not on file   Social Determinants of Health   Financial Resource Strain: Not on file  Food Insecurity: Not on file  Transportation Needs: Not on file  Physical Activity: Not on file  Stress: Not on file  Social Connections: Not on file  Intimate Partner Violence: Not on file     Current Outpatient Medications:    acetaminophen (TYLENOL) 325 MG tablet, Take 325-650 mg every 6 (six) hours as needed by mouth for moderate pain or headache., Disp: , Rfl:    clonazePAM (KLONOPIN) 0.5 MG tablet, Take 0.5 mg by mouth daily as needed., Disp: , Rfl:    fluticasone (FLONASE) 50 MCG/ACT nasal spray, SHAKE LIQUID  AND USE 1 SPRAY IN EACH NOSTRIL TWICE DAILY, Disp: , Rfl:    hydrOXYzine (ATARAX/VISTARIL) 25 MG tablet, Take 25 mg by mouth 3 (three) times daily., Disp: , Rfl:    loratadine (CLARITIN) 10 MG tablet, Take 10 mg by mouth daily., Disp: , Rfl:    Multiple Vitamin (MULTI-VITAMINS) TABS, Take 1 tablet daily by mouth. , Disp: , Rfl:    sertraline (ZOLOFT) 100 MG tablet, Take 100 mg by mouth daily., Disp: , Rfl:    traMADol (ULTRAM) 50 MG tablet, TAKE 1 TABLET(50 MG) BY MOUTH EVERY 8 HOURS FOR UP TO 5 DAYS AS NEEDED, Disp: 15 tablet, Rfl: 0     ROS:  Review of Systems BREAST: No  symptoms   Objective: There were no vitals taken for this visit.   OBGyn Exam  Results: No results found for this or any previous visit (from the past 24 hour(s)).  Assessment/Plan: No diagnosis found.  No orders of the defined types were placed in this encounter.            GYN counsel {counseling: 16159}     F/U  No follow-ups on file.  Kassadee Carawan B. Fran Mcree, PA-C 08/27/2022 7:20 PM

## 2022-08-28 ENCOUNTER — Ambulatory Visit (INDEPENDENT_AMBULATORY_CARE_PROVIDER_SITE_OTHER): Payer: Managed Care, Other (non HMO) | Admitting: Obstetrics and Gynecology

## 2022-08-28 ENCOUNTER — Encounter: Payer: Self-pay | Admitting: Obstetrics and Gynecology

## 2022-08-28 VITALS — BP 100/70 | Ht 66.0 in | Wt 154.0 lb

## 2022-08-28 DIAGNOSIS — Z0000 Encounter for general adult medical examination without abnormal findings: Secondary | ICD-10-CM

## 2022-08-28 DIAGNOSIS — R61 Generalized hyperhidrosis: Secondary | ICD-10-CM

## 2022-08-28 DIAGNOSIS — N946 Dysmenorrhea, unspecified: Secondary | ICD-10-CM | POA: Diagnosis not present

## 2022-08-28 DIAGNOSIS — R87615 Unsatisfactory cytologic smear of cervix: Secondary | ICD-10-CM

## 2022-08-28 DIAGNOSIS — N809 Endometriosis, unspecified: Secondary | ICD-10-CM | POA: Diagnosis not present

## 2022-08-28 DIAGNOSIS — Z01419 Encounter for gynecological examination (general) (routine) without abnormal findings: Secondary | ICD-10-CM

## 2022-08-28 DIAGNOSIS — Z1151 Encounter for screening for human papillomavirus (HPV): Secondary | ICD-10-CM

## 2022-08-28 DIAGNOSIS — Z124 Encounter for screening for malignant neoplasm of cervix: Secondary | ICD-10-CM

## 2022-08-28 DIAGNOSIS — Z01411 Encounter for gynecological examination (general) (routine) with abnormal findings: Secondary | ICD-10-CM

## 2022-08-28 NOTE — Patient Instructions (Signed)
I value your feedback and you entrusting us with your care. If you get a [facility] patient survey, I would appreciate you taking the time to let us know about your experience today. Thank you! ? ? ?

## 2022-08-29 LAB — CBC WITH DIFFERENTIAL/PLATELET
Basophils Absolute: 0 10*3/uL (ref 0.0–0.2)
Basos: 1 %
EOS (ABSOLUTE): 0.1 10*3/uL (ref 0.0–0.4)
Eos: 1 %
Hematocrit: 37.2 % (ref 34.0–46.6)
Hemoglobin: 12.4 g/dL (ref 11.1–15.9)
Immature Grans (Abs): 0 10*3/uL (ref 0.0–0.1)
Immature Granulocytes: 0 %
Lymphocytes Absolute: 2.9 10*3/uL (ref 0.7–3.1)
Lymphs: 47 %
MCH: 31.2 pg (ref 26.6–33.0)
MCHC: 33.3 g/dL (ref 31.5–35.7)
MCV: 94 fL (ref 79–97)
Monocytes Absolute: 0.6 10*3/uL (ref 0.1–0.9)
Monocytes: 9 %
Neutrophils Absolute: 2.7 10*3/uL (ref 1.4–7.0)
Neutrophils: 42 %
Platelets: 284 10*3/uL (ref 150–450)
RBC: 3.98 x10E6/uL (ref 3.77–5.28)
RDW: 12.1 % (ref 11.7–15.4)
WBC: 6.3 10*3/uL (ref 3.4–10.8)

## 2022-08-29 LAB — COMPREHENSIVE METABOLIC PANEL
ALT: 13 IU/L (ref 0–32)
AST: 14 IU/L (ref 0–40)
Albumin/Globulin Ratio: 2.5 — ABNORMAL HIGH (ref 1.2–2.2)
Albumin: 4.7 g/dL (ref 3.9–4.9)
Alkaline Phosphatase: 60 IU/L (ref 44–121)
BUN/Creatinine Ratio: 18 (ref 9–23)
BUN: 13 mg/dL (ref 6–20)
Bilirubin Total: <0.2 mg/dL (ref 0.0–1.2)
CO2: 21 mmol/L (ref 20–29)
Calcium: 9.9 mg/dL (ref 8.7–10.2)
Chloride: 103 mmol/L (ref 96–106)
Creatinine, Ser: 0.74 mg/dL (ref 0.57–1.00)
Globulin, Total: 1.9 g/dL (ref 1.5–4.5)
Glucose: 72 mg/dL (ref 70–99)
Potassium: 4.2 mmol/L (ref 3.5–5.2)
Sodium: 139 mmol/L (ref 134–144)
Total Protein: 6.6 g/dL (ref 6.0–8.5)
eGFR: 109 mL/min/{1.73_m2} (ref >59)

## 2022-08-29 LAB — TSH+FREE T4
Free T4: 1.21 ng/dL (ref 0.82–1.77)
TSH: 1.16 u[IU]/mL (ref 0.450–4.500)

## 2022-09-01 LAB — IGP, APTIMA HPV: HPV Aptima: NEGATIVE

## 2022-09-18 ENCOUNTER — Other Ambulatory Visit: Payer: Managed Care, Other (non HMO)

## 2022-09-18 ENCOUNTER — Encounter: Payer: Self-pay | Admitting: Obstetrics and Gynecology

## 2022-09-18 MED ORDER — NORETHINDRONE 0.35 MG PO TABS: 1.0000 | ORAL_TABLET | Freq: Every day | ORAL | 0 refills | Status: DC

## 2022-09-19 ENCOUNTER — Ambulatory Visit: Payer: Managed Care, Other (non HMO)

## 2022-09-20 ENCOUNTER — Encounter: Payer: Self-pay | Admitting: Obstetrics and Gynecology

## 2022-09-20 ENCOUNTER — Other Ambulatory Visit: Payer: Self-pay | Admitting: Obstetrics and Gynecology

## 2022-09-20 DIAGNOSIS — Z1321 Encounter for screening for nutritional disorder: Secondary | ICD-10-CM

## 2022-09-20 DIAGNOSIS — Z131 Encounter for screening for diabetes mellitus: Secondary | ICD-10-CM

## 2022-09-20 DIAGNOSIS — Z0000 Encounter for general adult medical examination without abnormal findings: Secondary | ICD-10-CM

## 2022-09-20 NOTE — Addendum Note (Signed)
Addended by: Ardeth Perfect B on: 03/28/8279 04:41 PM   Modules accepted: Orders

## 2022-09-22 LAB — HEMOGLOBIN A1C
Est. average glucose Bld gHb Est-mCnc: 100 mg/dL
Hgb A1c MFr Bld: 5.1 % (ref 4.8–5.6)

## 2022-09-22 LAB — VITAMIN D 25 HYDROXY (VIT D DEFICIENCY, FRACTURES): Vit D, 25-Hydroxy: 33.8 ng/mL (ref 30.0–100.0)

## 2022-09-22 LAB — MAGNESIUM: Magnesium: 2 mg/dL (ref 1.6–2.3)

## 2022-09-23 LAB — VITAMIN B12: Vitamin B-12: 394 pg/mL (ref 232–1245)

## 2022-12-09 ENCOUNTER — Other Ambulatory Visit: Payer: Self-pay | Admitting: Obstetrics and Gynecology

## 2023-02-18 ENCOUNTER — Other Ambulatory Visit: Payer: Self-pay | Admitting: Obstetrics and Gynecology

## 2023-03-21 ENCOUNTER — Other Ambulatory Visit: Payer: Self-pay | Admitting: Obstetrics and Gynecology

## 2023-03-21 MED ORDER — NORETHINDRONE 0.35 MG PO TABS: 1.0000 | ORAL_TABLET | Freq: Every day | ORAL | 1 refills | Status: DC

## 2023-03-21 NOTE — Progress Notes (Signed)
Rx RF micronor for periods to [place]. Having sx improvement.

## 2023-07-31 ENCOUNTER — Other Ambulatory Visit: Payer: Self-pay | Admitting: Obstetrics and Gynecology

## 2023-09-24 ENCOUNTER — Encounter: Payer: Self-pay | Admitting: Obstetrics and Gynecology

## 2023-09-25 MED ORDER — NORETHINDRONE 0.35 MG PO TABS: 1.0000 | ORAL_TABLET | Freq: Every day | ORAL | 0 refills | Status: DC

## 2023-10-10 ENCOUNTER — Ambulatory Visit: Payer: Self-pay | Admitting: Obstetrics and Gynecology

## 2023-10-21 ENCOUNTER — Ambulatory Visit: Payer: Self-pay | Admitting: Obstetrics and Gynecology

## 2023-11-12 ENCOUNTER — Encounter: Payer: Self-pay | Admitting: Obstetrics and Gynecology

## 2023-11-12 ENCOUNTER — Ambulatory Visit (INDEPENDENT_AMBULATORY_CARE_PROVIDER_SITE_OTHER): Payer: Self-pay | Admitting: Obstetrics and Gynecology

## 2023-11-12 VITALS — BP 110/72 | HR 70 | Ht 67.0 in | Wt 168.0 lb

## 2023-11-12 DIAGNOSIS — Z01419 Encounter for gynecological examination (general) (routine) without abnormal findings: Secondary | ICD-10-CM | POA: Diagnosis not present

## 2023-11-12 DIAGNOSIS — J302 Other seasonal allergic rhinitis: Secondary | ICD-10-CM

## 2023-11-12 DIAGNOSIS — N946 Dysmenorrhea, unspecified: Secondary | ICD-10-CM

## 2023-11-12 DIAGNOSIS — Z3041 Encounter for surveillance of contraceptive pills: Secondary | ICD-10-CM

## 2023-11-12 DIAGNOSIS — N809 Endometriosis, unspecified: Secondary | ICD-10-CM

## 2023-11-12 MED ORDER — NORETHINDRONE 0.35 MG PO TABS: 1.0000 | ORAL_TABLET | Freq: Every day | ORAL | 3 refills | Status: DC

## 2023-11-12 MED ORDER — LEVOCETIRIZINE DIHYDROCHLORIDE 5 MG PO TABS: 5.0000 mg | ORAL_TABLET | Freq: Every evening | ORAL | 3 refills | Status: AC

## 2023-11-12 NOTE — Progress Notes (Signed)
 PCP:  Kandyce Rud, MD   Chief Complaint  Patient presents with   Gynecologic Exam    No concerns     HPI:      [name] is a 33 y.o. [phone] whose LMP was Patient's last menstrual period was 11/10/2023 (exact date)., presents today for her annual examination.  Her menses are every 1-2 months now with POPs, started last yr for endometriosis/dysmen pain; light or spotting a few days without dysmen vs mod flow for 3-4 days with mild dysmen, much improved with POPs. Decreased night sweats too, usually related to stress.   Hx of endometriosis in past with ovar cystectomy. Was on OCPs in between pregnancies but s/p TL after last pregnancy. No non-menstrual pelvic pain. Tried Mirena PP but it spontaneously expelled. Hx of migraines with aura, not related to menses.   Sex activity: single partner, contraception - tubal ligation. No pain/bleeding. Last Pap: 08/28/22 Results were: NILM/neg HPV DNA.   There is no FH of breast cancer. There is no FH of ovarian cancer. The patient does do self-breast exams.  Tobacco use: The patient denies current or previous tobacco use. Alcohol use: social drinker No drug use.  Exercise: mod active  She does get adequate calcium and Vitamin D in her diet. Hx of seasonal allergies; takes zyrtec daily without relief. Also does flonase. Eyelids are itchy and crusty in mornings. Tried singulair for sx but gave her anxiety/mood sx. Wonders about different Rx.    Patient Active Problem List   Diagnosis Date Noted   Right shoulder pain 03/07/2022   Right knee pain 10/25/2020   Left foot pain 09/13/2020   Cervicogenic headache 09/13/2020   Nonallopathic lesion of cervical region 09/13/2020   Morton's neuroma of left foot 05/23/2020   Anxiety 04/20/2019   Endometriosis 07/15/2017   Postpartum anemia 04/10/2017   Right ovarian cyst 05/18/2016    Past Surgical History:  Procedure Laterality Date   DILATION AND CURETTAGE OF UTERUS N/A  07/15/2017   Procedure: DILATATION AND CURETTAGE;  Surgeon: Hildred Laser, MD;  Location: [facility];  Service: Gynecology;  Laterality: N/A;   LAPAROSCOPIC OVARIAN CYSTECTOMY Right 05/18/2016   Procedure: LAPAROSCOPIC OVARIAN CYSTECTOMY;  Surgeon: Nadara Mustard, MD;  Location: [facility];  Service: Gynecology;  Laterality: Right;   LAPAROSCOPIC TUBAL LIGATION Bilateral 07/15/2017   Procedure: LAPAROSCOPIC BILATERAL TUBAL LIGATION;  Surgeon: Hildred Laser, MD;  Location: [facility];  Service: Gynecology;  Laterality: Bilateral;   TUBAL LIGATION     Pt reports tubal ligation in 2017, 2018    Family History  Problem Relation Age of Onset   Cancer Paternal Aunt        melanoma    Social History   Socioeconomic History   Marital status: Married    Spouse name: Not on file   Number of children: Not on file   Years of education: Not on file   Highest education level: Not on file  Occupational History   Not on file  Tobacco Use   Smoking status: Never   Smokeless tobacco: Never  Vaping Use   Vaping status: Never Used  Substance and Sexual Activity   Alcohol use: No   Drug use: No   Sexual activity: Yes    Birth control/protection: Surgical    Comment: tubal ligation  Other Topics Concern   Not on file  Social History Narrative   Not on file   Social Drivers of Health   Financial Resource Strain: Patient  Declined (12/11/2022)   Received from [facility]   Overall Financial Resource Strain (CARDIA)    Difficulty of Paying Living Expenses: Patient declined  Food Insecurity: Patient Declined (12/11/2022)   Received from [facility]   Hunger Vital Sign    Worried About Running Out of Food in the Last Year: Patient declined    Ran Out of Food in the Last Year: Patient declined  Transportation Needs: Patient Declined (12/11/2022)   Received from [facility]   PRAPARE - Transportation    In the past 12 months, has lack of  transportation kept you from medical appointments or from getting medications?: Patient declined    Lack of Transportation (Non-Medical): Patient declined  Physical Activity: Not on file  Stress: Not on file  Social Connections: Not on file  Intimate Partner Violence: Not on file     Current Outpatient Medications:    acetaminophen (TYLENOL) 325 MG tablet, Take 325-650 mg every 6 (six) hours as needed by mouth for moderate pain or headache., Disp: , Rfl:    busPIRone (BUSPAR) 10 MG tablet, Take 10 mg by mouth daily., Disp: , Rfl:    fluticasone (FLONASE) 50 MCG/ACT nasal spray, SHAKE LIQUID AND USE 1 SPRAY IN EACH NOSTRIL TWICE DAILY, Disp: , Rfl:    levocetirizine (XYZAL) 5 MG tablet, Take 1 tablet (5 mg total) by mouth every evening., Disp: 30 tablet, Rfl: 3   Multiple Vitamin (MULTI-VITAMINS) TABS, Take 1 tablet daily by mouth. , Disp: , Rfl:    rizatriptan (MAXALT-MLT) 10 MG disintegrating tablet, TAKE 1 TABLET BY MOUTH AS NEEDED FOR MIGRAINE. MAY REPEAT IN 2 HOURS IF NEEDED, Disp: , Rfl:    sertraline (ZOLOFT) 100 MG tablet, Take 100 mg by mouth daily., Disp: , Rfl:    triamcinolone ointment (KENALOG) 0.1 %, SMARTSIG:Sparingly Topical Twice Daily, Disp: , Rfl:    norethindrone (MICRONOR) 0.35 MG tablet, Take 1 tablet (0.35 mg total) by mouth daily., Disp: 84 tablet, Rfl: 3     ROS:  Review of Systems  Constitutional:  Negative for fatigue, fever and unexpected weight change.  Respiratory:  Negative for cough, shortness of breath and wheezing.   Cardiovascular:  Negative for chest pain, palpitations and leg swelling.  Gastrointestinal:  Negative for blood in stool, constipation, diarrhea, nausea and vomiting.  Endocrine: Negative for cold intolerance, heat intolerance and polyuria.  Genitourinary:  Negative for dyspareunia, dysuria, flank pain, frequency, genital sores, hematuria, menstrual problem, pelvic pain, urgency, vaginal bleeding, vaginal discharge and vaginal pain.   Musculoskeletal:  Negative for back pain, joint swelling and myalgias.  Skin:  Negative for rash.  Neurological:  Negative for dizziness, syncope, light-headedness, numbness and headaches.  Hematological:  Negative for adenopathy.  Psychiatric/Behavioral:  Negative for agitation, confusion, sleep disturbance and suicidal ideas. The patient is not nervous/anxious.    BREAST: No symptoms   Objective: BP 110/72   Pulse 70   Ht 5\' 7"  (1.702 m)   Wt 168 lb (76.2 kg)   LMP 11/10/2023 (Exact Date)   BMI 26.31 kg/m    Physical Exam Constitutional:      Appearance: She is well-developed.  Genitourinary:     Vulva normal.     Right Labia: No rash, tenderness or lesions.    Left Labia: No tenderness, lesions or rash.    No vaginal discharge, erythema or tenderness.      Right Adnexa: not tender and no mass present.    Left Adnexa: not  tender and no mass present.    No cervical friability or polyp.     Uterus is not enlarged or tender.  Breasts:    Right: No mass, nipple discharge, skin change or tenderness.     Left: No mass, nipple discharge, skin change or tenderness.  Neck:     Thyroid: No thyromegaly.  Cardiovascular:     Rate and Rhythm: Normal rate and regular rhythm.     Heart sounds: Normal heart sounds. No murmur heard. Pulmonary:     Effort: Pulmonary effort is normal.     Breath sounds: Normal breath sounds.  Abdominal:     Palpations: Abdomen is soft.     Tenderness: There is no abdominal tenderness. There is no guarding or rebound.  Musculoskeletal:        General: Normal range of motion.     Cervical back: Normal range of motion.  Lymphadenopathy:     Cervical: No cervical adenopathy.  Neurological:     General: No focal deficit present.     Mental Status: She is alert and oriented to person, place, and time.     Cranial Nerves: No cranial nerve deficit.  Skin:    General: Skin is warm and dry.  Psychiatric:        Mood and Affect: Mood normal.         Behavior: Behavior normal.        Thought Content: Thought content normal.        Judgment: Judgment normal.  Vitals reviewed.    Assessment/Plan: Encounter for annual routine gynecological examination  Encounter for surveillance of contraceptive pills - Plan: norethindrone (MICRONOR) 0.35 MG tablet; Rx RF eRxd.   Dysmenorrhea - Plan: norethindrone (MICRONOR) 0.35 MG tablet; much improved with POPs. F/u prn.   Endometriosis - Plan: norethindrone (MICRONOR) 0.35 MG tablet; much improved with POPs. F/u prn.   Seasonal allergies - Plan: levocetirizine (XYZAL) 5 MG tablet; Rx eRxd. Can alternate claritin and zyrtec when not taking xyzal. F/u prn.  Meds ordered this encounter  Medications   levocetirizine (XYZAL) 5 MG tablet    Sig: Take 1 tablet (5 mg total) by mouth every evening.    Dispense:  30 tablet    Refill:  3    Supervising Provider:   Hildred Laser [[id number]]   norethindrone (MICRONOR) 0.35 MG tablet    Sig: Take 1 tablet (0.35 mg total) by mouth daily.    Dispense:  84 tablet    Refill:  3    Supervising Provider:   Waymon Budge           GYN counsel adequate intake of calcium and vitamin D, diet and exercise     F/U  Return in about 1 year (around 11/11/2024).  Jeri Rawlins B. Jefte Carithers, PA-C 11/12/2023 5:05 PM

## 2023-11-12 NOTE — Patient Instructions (Signed)
 I value your feedback and you entrusting [place] with your care. If you get a [facility] patient survey, I would appreciate you taking the time to let us know about your experience today. Thank you! ? ? ?

## 2023-11-14 ENCOUNTER — Encounter: Payer: Self-pay | Admitting: Obstetrics and Gynecology

## 2023-11-18 NOTE — Telephone Encounter (Signed)
 Per [facility] called pt and she is aware.

## 2023-11-20 ENCOUNTER — Telehealth: Payer: Self-pay

## 2023-11-27 NOTE — Telephone Encounter (Signed)
 [facility] calling regarding pt's BC Rx norethindrone (MICRONOR) 0.35 MG tablet.

## 2023-11-28 DIAGNOSIS — N809 Endometriosis, unspecified: Secondary | ICD-10-CM

## 2023-11-28 DIAGNOSIS — Z3041 Encounter for surveillance of contraceptive pills: Secondary | ICD-10-CM

## 2023-11-28 DIAGNOSIS — N946 Dysmenorrhea, unspecified: Secondary | ICD-10-CM

## 2023-11-28 NOTE — Telephone Encounter (Signed)
 Same branded product but diff generic name.

## 2023-11-28 NOTE — Telephone Encounter (Signed)
 [employer] called about Facilities manager. They said that they got a prescription transfer from [facility] 12/10/2022 for Lyleg. They want to know if patient wants to continue to take Lyleg or start taking the Micronor. I called patient and left VM for her to contact [employer] and let them know her preference. [phone]

## 2023-12-15 ENCOUNTER — Other Ambulatory Visit: Payer: Self-pay | Admitting: Obstetrics and Gynecology

## 2023-12-15 DIAGNOSIS — N809 Endometriosis, unspecified: Secondary | ICD-10-CM

## 2023-12-15 DIAGNOSIS — Z3041 Encounter for surveillance of contraceptive pills: Secondary | ICD-10-CM

## 2023-12-15 DIAGNOSIS — N946 Dysmenorrhea, unspecified: Secondary | ICD-10-CM

## 2023-12-20 NOTE — Progress Notes (Unsigned)
 [facility] [address] [place] [address] Phone: [phone] Subjective:   Delwyn Filippo, am serving as a scribe for Dr. Ronnell Coins.  I'm seeing this patient by the request  of:  Nestor Banter, MD  CC: right hand pain   [id number]  MADDYX BEAMES is a 33 y.o. female coming in with complaint of  R hand pain for past 4 weeks. Patient states that she pulled a mower cord and the cord was stuck. Developed sharp hand pain in palmar aspect that radiates up into the forearm. Trouble gripping items and writing. Pain near the metacarpals of the middle and ring finger.   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        Past Medical History:  Diagnosis Date   Anxiety    Family history of adverse reaction to anesthesia    aunt   migraine    Past Surgical History:  Procedure Laterality Date   DILATION AND CURETTAGE OF UTERUS N/A 07/15/2017   Procedure: DILATATION AND CURETTAGE;  Surgeon: Teresa Fender, MD;  Location: [facility];  Service: Gynecology;  Laterality: N/A;   LAPAROSCOPIC OVARIAN CYSTECTOMY Right 05/18/2016   Procedure: LAPAROSCOPIC OVARIAN CYSTECTOMY;  Surgeon: Alben Alma, MD;  Location: [facility];  Service: Gynecology;  Laterality: Right;   LAPAROSCOPIC TUBAL LIGATION Bilateral 07/15/2017   Procedure: LAPAROSCOPIC BILATERAL TUBAL LIGATION;  Surgeon: Teresa Fender, MD;  Location: [facility];  Service: Gynecology;  Laterality: Bilateral;   TUBAL LIGATION     Pt reports tubal ligation in 2017, 2018   Social History   Socioeconomic History   Marital status: Married    Spouse name: Not on file   Number of children: Not on file   Years of education: Not on file   Highest education level: Not on file  Occupational History   Not on file  Tobacco Use   Smoking status: Never   Smokeless tobacco: Never  Vaping Use   Vaping status: Never Used   Substance and Sexual Activity   Alcohol use: No   Drug use: No   Sexual activity: Yes    Birth control/protection: Surgical    Comment: tubal ligation  Other Topics Concern   Not on file  Social History Narrative   Not on file   Social Drivers of Health   Financial Resource Strain: Patient Declined (12/11/2022)   Received from [facility]   Overall Financial Resource Strain (CARDIA)    Difficulty of Paying Living Expenses: Patient declined  Food Insecurity: Patient Declined (12/11/2022)   Received from [facility]   Hunger Vital Sign    Worried About Running Out of Food in the Last Year: Patient declined    Ran Out of Food in the Last Year: Patient declined  Transportation Needs: Patient Declined (12/11/2022)   Received from [facility]   PRAPARE - Transportation    In the past 12 months, has lack of transportation kept you from medical appointments or from getting medications?: Patient declined    Lack of Transportation (Non-Medical): Patient declined  Physical Activity: Not on file  Stress: Not on file  Social Connections: Not on file   Allergies  Allergen Reactions   Sulfa Antibiotics Other (See Comments)    Childhood allergy   Family History  Problem Relation Age of Onset   Cancer Paternal Aunt  melanoma    Current Outpatient Medications (Endocrine & Metabolic):    norethindrone  (MICRONOR ) 0.35 MG tablet, Take 1 tablet (0.35 mg total) by mouth daily.   Current Outpatient Medications (Respiratory):    fluticasone (FLONASE) 50 MCG/ACT nasal spray, SHAKE LIQUID AND USE 1 SPRAY IN EACH NOSTRIL TWICE DAILY   levocetirizine (XYZAL ) 5 MG tablet, Take 1 tablet (5 mg total) by mouth every evening.  Current Outpatient Medications (Analgesics):    acetaminophen  (TYLENOL ) 325 MG tablet, Take 325-650 mg every 6 (six) hours as needed by mouth for moderate pain or headache.   rizatriptan  (MAXALT -MLT) 10 MG  disintegrating tablet, TAKE 1 TABLET BY MOUTH AS NEEDED FOR MIGRAINE. MAY REPEAT IN 2 HOURS IF NEEDED   Current Outpatient Medications (Other):    busPIRone (BUSPAR) 10 MG tablet, Take 10 mg by mouth daily.   Multiple Vitamin (MULTI-VITAMINS) TABS, Take 1 tablet daily by mouth.    sertraline  (ZOLOFT ) 100 MG tablet, Take 100 mg by mouth daily.   triamcinolone  ointment (KENALOG ) 0.1 %, SMARTSIG:Sparingly Topical Twice Daily   Reviewed prior external information including notes and imaging from  primary care provider As well as notes that were available from care everywhere and other healthcare systems.  Past medical history, social, surgical and family history all reviewed in electronic medical record.  No pertanent information unless stated regarding to the chief complaint.   Review of Systems:  No , visual changes, nausea, vomiting, diarrhea, constipation, dizziness, abdominal pain, skin rash, fevers, chills, night sweats, weight loss, swollen lymph nodes, body aches, joint swelling, chest pain, shortness of breath, mood changes. POSITIVE muscle aches, headache  Objective  There were no vitals taken for this visit.   General: No apparent distress alert and oriented x3 mood and affect normal, dressed appropriately.  HEENT: Pupils equal, extraocular movements intact  Respiratory: Patient's speak in full sentences and does not appear short of breath  Cardiovascular: No lower extremity edema, non tender, no erythema  Hand exam shows patient does have some tenderness to palpation between the ring and middle finger on the right hand.  No significant swelling noted.  Good grip strength noted the patient does state that it is uncomfortable.  Neck exam does have some loss lordosis.  Difficulty with sidebending bilaterally.  Tightness with Veldon German right greater than left.  Negative straight leg test.  Limited muscular skeletal ultrasound was performed and interpreted by Ronnell Coins, M  Limited  ultrasound shows that there is a muscle injury noted of the flexor profundus.  Seems to be healing.  Increasing in neovascularization.  Tendon does have a anatomical variant but symmetric to contralateral side. Impression: Forearm injury with healing properties   Osteopathic findings C3 flexed rotated and side bent right C4 flexed rotated and side bent left C7 flexed rotated and side bent left T3 extended rotated and side bent right inhaled third rib T9 extended rotated and side bent left L2 flexed rotated and side bent right L3 flexed rotated and side bent left Sacrum right on right    Impression and Recommendations:  No problem-specific Assessment & Plan notes found for this encounter.     The above documentation has been reviewed and is accurate and complete Daviona Herbert M Xandrea Clarey, DO

## 2023-12-23 ENCOUNTER — Other Ambulatory Visit: Payer: Self-pay

## 2023-12-23 ENCOUNTER — Ambulatory Visit: Admitting: Family Medicine

## 2023-12-23 ENCOUNTER — Encounter: Payer: Self-pay | Admitting: Family Medicine

## 2023-12-23 ENCOUNTER — Other Ambulatory Visit: Payer: Self-pay | Admitting: Obstetrics and Gynecology

## 2023-12-23 VITALS — BP 128/88 | HR 69 | Ht 67.0 in | Wt 163.0 lb

## 2023-12-23 DIAGNOSIS — M9902 Segmental and somatic dysfunction of thoracic region: Secondary | ICD-10-CM | POA: Diagnosis not present

## 2023-12-23 DIAGNOSIS — G4486 Cervicogenic headache: Secondary | ICD-10-CM

## 2023-12-23 DIAGNOSIS — N946 Dysmenorrhea, unspecified: Secondary | ICD-10-CM

## 2023-12-23 DIAGNOSIS — M9901 Segmental and somatic dysfunction of cervical region: Secondary | ICD-10-CM

## 2023-12-23 DIAGNOSIS — M9903 Segmental and somatic dysfunction of lumbar region: Secondary | ICD-10-CM | POA: Diagnosis not present

## 2023-12-23 DIAGNOSIS — Z3041 Encounter for surveillance of contraceptive pills: Secondary | ICD-10-CM

## 2023-12-23 DIAGNOSIS — M9908 Segmental and somatic dysfunction of rib cage: Secondary | ICD-10-CM

## 2023-12-23 DIAGNOSIS — M9904 Segmental and somatic dysfunction of sacral region: Secondary | ICD-10-CM

## 2023-12-23 DIAGNOSIS — M79641 Pain in right hand: Secondary | ICD-10-CM | POA: Diagnosis not present

## 2023-12-23 DIAGNOSIS — S6991XA Unspecified injury of right wrist, hand and finger(s), initial encounter: Secondary | ICD-10-CM | POA: Insufficient documentation

## 2023-12-23 DIAGNOSIS — N809 Endometriosis, unspecified: Secondary | ICD-10-CM

## 2023-12-23 NOTE — Assessment & Plan Note (Signed)
 Old problem with current exacerbation.  Patient has started working and we have not seen her for some time.  Discussed icing regimen of home exercises, discussed which activities to do and which ones to avoid.  Increase activity slowly otherwise.  Discussed home exercises.  Follow-up again in 6 to 8 weeks

## 2023-12-23 NOTE — Patient Instructions (Signed)
 Wrist Hep. Flexor Profundus. Wrap when doing activities. See you again in 6 to 8 weeks. Good to see hubby.

## 2023-12-23 NOTE — Assessment & Plan Note (Signed)
 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  Discussed with patient about avoiding certain activity and taping.  Should be better though with some time.  Differential includes a nerve irritation but I think patient will do well.  Follow-up again in 6 to 8 weeks

## 2023-12-27 ENCOUNTER — Other Ambulatory Visit: Payer: Self-pay | Admitting: Obstetrics and Gynecology

## 2023-12-27 DIAGNOSIS — Z3041 Encounter for surveillance of contraceptive pills: Secondary | ICD-10-CM

## 2023-12-27 DIAGNOSIS — N946 Dysmenorrhea, unspecified: Secondary | ICD-10-CM

## 2023-12-27 DIAGNOSIS — N809 Endometriosis, unspecified: Secondary | ICD-10-CM

## 2023-12-31 MED ORDER — NORETHINDRONE 0.35 MG PO TABS: 1.0000 | ORAL_TABLET | Freq: Every day | ORAL | 0 refills | Status: DC

## 2023-12-31 NOTE — Telephone Encounter (Signed)
 I called pt to get clarification on what's going on. She stated she has been out of her [facility] for 3 weeks now, she's having migraines. [employer] is refusing to send her refills. Would like to continue Lyleq  brand and is asking for 1 refill to be sent to local pharmacy and the rest to her [facility] pharmacy since it is cheaper through them. Rx's re sent.

## 2023-12-31 NOTE — Addendum Note (Signed)
 Addended by: Lendon Queen on: 12/31/2023 11:06 AM   Modules accepted: Orders

## 2024-01-25 ENCOUNTER — Other Ambulatory Visit: Payer: Self-pay | Admitting: Obstetrics and Gynecology

## 2024-01-25 DIAGNOSIS — N946 Dysmenorrhea, unspecified: Secondary | ICD-10-CM

## 2024-01-25 DIAGNOSIS — N809 Endometriosis, unspecified: Secondary | ICD-10-CM

## 2024-01-25 DIAGNOSIS — Z3041 Encounter for surveillance of contraceptive pills: Secondary | ICD-10-CM

## 2024-01-31 NOTE — Progress Notes (Unsigned)
 [facility] [address] [place] [address] Phone: [phone] Subjective:   [facility], am serving as a scribe for Dr. Ronnell Coins.  I'm seeing this patient by the request  of:  Nestor Banter, MD  CC: Back and neck pain follow-up  [id number]  Sara Webb is a 33 y.o. female coming in with complaint of back and neck pain. OMT 12/23/2023. Also f/u for R wrist pain. Patient states doing well. Wrist is not worse, but not better. Just more nagging than anything else. Still can't hold on to anything and grip strength is still diminished.  Medications patient has been prescribed: None  Taking:         Reviewed prior external information including notes and imaging from previsou exam, outside providers and external EMR if available.   As well as notes that were available from care everywhere and other healthcare systems.  Past medical history, social, surgical and family history all reviewed in electronic medical record.  No pertanent information unless stated regarding to the chief complaint.   Past Medical History:  Diagnosis Date   Anxiety    Family history of adverse reaction to anesthesia    aunt   migraine     Allergies  Allergen Reactions   Sulfa Antibiotics Other (See Comments)    Childhood allergy     Review of Systems:  No  visual changes, nausea, vomiting, diarrhea, constipation, dizziness, abdominal pain, skin rash, fevers, chills, night sweats, weight loss, swollen lymph nodes, body aches, joint swelling, chest pain, shortness of breath, mood changes. POSITIVE muscle aches, headache  Objective  Blood pressure 120/76, pulse 70, height 5' 7 (1.702 m), weight 164 lb (74.4 kg), SpO2 97%.   General: No apparent distress alert and oriented x3 mood and affect normal, dressed appropriately.  HEENT: Pupils equal, extraocular movements intact  Respiratory: Patient's speak in full sentences and does not appear short  of breath  Cardiovascular: No lower extremity edema, non tender, no erythema  Gait MSK:  Back does have some loss of lordosis.  Neck exam does have some loss of lordosis as well. Tightness with sidebending right greater than left negative Spurling's noted today. Patient does have some tightness noted also of the right wrist.  Positive Tinel's sign on the right wrist.  Does not seem to be as much in the forearm anymore.  No thenar eminence wasting.  Osteopathic findings  C2 flexed rotated and side bent right C6 flexed rotated and side bent left T3 extended rotated and side bent right inhaled rib T9 extended rotated and side bent left L2 flexed rotated and side bent right L3 flexed rotated and side bent left Sacrum right on right     Assessment and Plan:  Cervicogenic headache Cervicogenic headaches, discussed icing regimen and home exercises, discussed which activities to do and which ones to avoid.  Increase activity slowly.  Respond extremely well to osteopathic manipulation.  Follow-up with me again in 6 to 8 weeks  Right carpal tunnel syndrome Bracing, home exercises, gabapentin 200 mg given at night.  Follow-up again in 6 to 8 weeks worsening pain consider nerve conduction study or injections    Nonallopathic problems  Decision today to treat with OMT was based on Physical Exam  After verbal consent patient was treated with HVLA, ME, FPR techniques in cervical, rib, thoracic, lumbar, and sacral  areas  Patient tolerated the procedure well with improvement in symptoms  Patient given exercises, stretches and  lifestyle modifications  See medications in patient instructions if given  Patient will follow up in 4-8 weeks     The above documentation has been reviewed and is accurate and complete Sara [facility] M Derry Kassel, DO         Note: This dictation was prepared with Dragon dictation along with smaller phrase technology. Any transcriptional errors that result from this  process are unintentional.

## 2024-02-03 ENCOUNTER — Encounter: Payer: Self-pay | Admitting: Family Medicine

## 2024-02-03 ENCOUNTER — Ambulatory Visit: Admitting: Family Medicine

## 2024-02-03 VITALS — BP 120/76 | HR 70 | Ht 67.0 in | Wt 164.0 lb

## 2024-02-03 DIAGNOSIS — G5601 Carpal tunnel syndrome, right upper limb: Secondary | ICD-10-CM | POA: Diagnosis not present

## 2024-02-03 DIAGNOSIS — G4486 Cervicogenic headache: Secondary | ICD-10-CM

## 2024-02-03 DIAGNOSIS — M9903 Segmental and somatic dysfunction of lumbar region: Secondary | ICD-10-CM | POA: Diagnosis not present

## 2024-02-03 DIAGNOSIS — M9902 Segmental and somatic dysfunction of thoracic region: Secondary | ICD-10-CM | POA: Diagnosis not present

## 2024-02-03 DIAGNOSIS — M9904 Segmental and somatic dysfunction of sacral region: Secondary | ICD-10-CM

## 2024-02-03 DIAGNOSIS — M9901 Segmental and somatic dysfunction of cervical region: Secondary | ICD-10-CM | POA: Diagnosis not present

## 2024-02-03 MED ORDER — GABAPENTIN 100 MG PO CAPS: 200.0000 mg | ORAL_CAPSULE | Freq: Every day | ORAL | 0 refills | Status: DC

## 2024-02-03 NOTE — Assessment & Plan Note (Signed)
 Cervicogenic headaches, discussed icing regimen and home exercises, discussed which activities to do and which ones to avoid.  Increase activity slowly.  Respond extremely well to osteopathic manipulation.  Follow-up with me again in 6 to 8 weeks

## 2024-02-03 NOTE — Patient Instructions (Signed)
 Good to see you! Do prescribed exercises at least 3x a week You have 14 days to return or exchange your brace Call [phone], then return the brace to our office Gabapentin 200mg  at night only See you again in 6-8 weeks

## 2024-02-03 NOTE — Assessment & Plan Note (Signed)
 Bracing, home exercises, gabapentin 200 mg given at night.  Follow-up again in 6 to 8 weeks worsening pain consider nerve conduction study or injections

## 2024-02-10 ENCOUNTER — Other Ambulatory Visit: Payer: Self-pay | Admitting: Obstetrics and Gynecology

## 2024-02-10 DIAGNOSIS — J302 Other seasonal allergic rhinitis: Secondary | ICD-10-CM

## 2024-03-17 ENCOUNTER — Ambulatory Visit: Admitting: Family Medicine

## 2024-05-02 ENCOUNTER — Other Ambulatory Visit: Payer: Self-pay | Admitting: Family Medicine

## 2024-05-25 ENCOUNTER — Encounter: Payer: Self-pay | Admitting: Family Medicine

## 2024-08-07 ENCOUNTER — Other Ambulatory Visit: Payer: Self-pay | Admitting: Family Medicine

## 2024-08-22 ENCOUNTER — Encounter: Payer: Self-pay | Admitting: Family Medicine

## 2024-09-08 ENCOUNTER — Ambulatory Visit: Admitting: Family Medicine

## 2024-09-08 ENCOUNTER — Encounter: Payer: Self-pay | Admitting: Family Medicine

## 2024-09-08 VITALS — BP 118/82 | HR 68 | Ht 67.0 in | Wt 160.0 lb

## 2024-09-08 DIAGNOSIS — M9902 Segmental and somatic dysfunction of thoracic region: Secondary | ICD-10-CM

## 2024-09-08 DIAGNOSIS — M9904 Segmental and somatic dysfunction of sacral region: Secondary | ICD-10-CM

## 2024-09-08 DIAGNOSIS — M533 Sacrococcygeal disorders, not elsewhere classified: Secondary | ICD-10-CM | POA: Diagnosis not present

## 2024-09-08 DIAGNOSIS — M9901 Segmental and somatic dysfunction of cervical region: Secondary | ICD-10-CM | POA: Diagnosis not present

## 2024-09-08 DIAGNOSIS — M9903 Segmental and somatic dysfunction of lumbar region: Secondary | ICD-10-CM

## 2024-09-08 DIAGNOSIS — M9908 Segmental and somatic dysfunction of rib cage: Secondary | ICD-10-CM

## 2024-09-08 NOTE — Assessment & Plan Note (Signed)
 Right sided, responded extremely well after evaluation with osteopathic manipulation.  Discussed home exercises and icing regimen.  Increase activity slowly.  Discussed which activities to do and which ones to avoid.  Increase activity slowly.  Discussed icing regimen.  Follow-up again in 6 to 8 weeks

## 2024-09-08 NOTE — Patient Instructions (Signed)
 Good to see you! Do prescribed exercises at least 3x a week Remember the ball trick See you again in 2 months

## 2024-09-08 NOTE — Progress Notes (Signed)
" °  [facility] [address] [place] [address] Phone: [phone] Subjective:   Sara Webb, am serving as a scribe for Dr. Arthea Webb.  I'Sara seeing this patient by the request  of:  Diedra Lame, MD  CC: Neck pain, headache  YEP:[id number]  Sara Webb is a 34 y.o. female coming in with complaint of back and neck pain. OMT on 02/03/2024. Patient states catching in the low back. No other symptoms.  Medications patient has been prescribed:   Taking:         Reviewed prior external information including notes and imaging from previsou exam, outside providers and external EMR if available.   As well as notes that were available from care everywhere and other healthcare systems.  Past medical history, social, surgical and family history all reviewed in electronic medical record.  No pertanent information unless stated regarding to the chief complaint.   Past Medical History:  Diagnosis Date   Anxiety    Family history of adverse reaction to anesthesia    aunt   migraine     Allergies[1]   Review of Systems:  No  visual changes, nausea, vomiting, diarrhea, constipation, dizziness, abdominal pain, skin rash, fevers, chills, night sweats, weight loss, swollen lymph nodes, body aches, joint swelling, chest pain, shortness of breath, mood changes. POSITIVE muscle aches, headache  Objective  Blood pressure 118/82, pulse 68, height 5' 7 (1.702 Sara), weight 160 lb (72.6 kg), SpO2 97%.   General: No apparent distress alert and oriented x3 mood and affect normal, dressed appropriately.  HEENT: Pupils equal, extraocular movements intact  Respiratory: Patient's speak in full sentences and does not appear short of breath  Cardiovascular: No lower extremity edema, non tender, no erythema  Gait MSK:  Back significant tightness noted on the right sacroiliac joint.  Positive FABER test noted.  Negative straight leg test  noted.    Osteopathic findings  C3 flexed rotated and side bent right T3 extended rotated and side bent right inhaled rib T9 extended rotated and side bent left L5 flexed rotated and side bent right Sacrum right on right       Assessment and Plan:  SI (sacroiliac) joint dysfunction Right sided, responded extremely well after evaluation with osteopathic manipulation.  Discussed home exercises and icing regimen.  Increase activity slowly.  Discussed which activities to do and which ones to avoid.  Increase activity slowly.  Discussed icing regimen.  Follow-up again in 6 to 8 weeks    Nonallopathic problems  Decision today to treat with OMT was based on Physical Exam  After verbal consent patient was treated with HVLA, ME, FPR techniques in cervical, rib, thoracic, lumbar, and sacral  areas  Patient tolerated the procedure well with improvement in symptoms  Patient given exercises, stretches and lifestyle modifications  See medications in patient instructions if given  Patient will follow up in 4-8 weeks     The above documentation has been reviewed and is accurate and complete Sara Webb Sara Kea Callan, DO         Note: This dictation was prepared with Dragon dictation along with smaller phrase technology. Any transcriptional errors that result from this process are unintentional.            [1]  Allergies Allergen Reactions   Sulfa Antibiotics Other (See Comments)    Childhood allergy   "

## 2024-09-22 ENCOUNTER — Ambulatory Visit: Admitting: Family Medicine

## 2024-09-28 ENCOUNTER — Ambulatory Visit: Admitting: Family Medicine

## 2024-11-06 ENCOUNTER — Ambulatory Visit: Admitting: Family Medicine
